# Patient Record
Sex: Female | Born: 1990 | Hispanic: Yes | Marital: Single | Smoking: Never smoker
Health system: Southern US, Community
[De-identification: ages and names within clinical notes are randomized; demographics above are authoritative.]

## PROBLEM LIST (undated history)

## (undated) DIAGNOSIS — Z3491 Encounter for supervision of normal pregnancy, unspecified, first trimester: Principal | ICD-10-CM

## (undated) DIAGNOSIS — I10 Essential (primary) hypertension: Secondary | ICD-10-CM

## (undated) DIAGNOSIS — D649 Anemia, unspecified: Secondary | ICD-10-CM

## (undated) HISTORY — DX: Anemia, unspecified: D64.9

## (undated) HISTORY — DX: Encounter for supervision of normal pregnancy, unspecified, first trimester: Z34.91

## (undated) HISTORY — DX: Essential (primary) hypertension: I10

---

## 2011-12-07 ENCOUNTER — Other Ambulatory Visit (HOSPITAL_COMMUNITY)
Admission: RE | Admit: 2011-12-07 | Discharge: 2011-12-07 | Disposition: A | Discharge status: Home / Self Care | Payer: Self-pay | Source: Ambulatory Visit | Attending: Unknown Physician Specialty | Admitting: Unknown Physician Specialty

## 2011-12-07 DIAGNOSIS — R87612 Low grade squamous intraepithelial lesion on cytologic smear of cervix (LGSIL): Secondary | ICD-10-CM | POA: Insufficient documentation

## 2011-12-07 DIAGNOSIS — N870 Mild cervical dysplasia: Secondary | ICD-10-CM | POA: Insufficient documentation

## 2012-05-20 ENCOUNTER — Emergency Department (HOSPITAL_COMMUNITY): Payer: Self-pay

## 2012-05-20 ENCOUNTER — Emergency Department (HOSPITAL_COMMUNITY)
Admission: EM | Admit: 2012-05-20 | Discharge: 2012-05-20 | Disposition: A | Discharge status: Home / Self Care | Payer: Self-pay | Attending: Emergency Medicine | Admitting: Emergency Medicine

## 2012-05-20 ENCOUNTER — Encounter (HOSPITAL_COMMUNITY): Payer: Self-pay | Admitting: *Deleted

## 2012-05-20 DIAGNOSIS — N949 Unspecified condition associated with female genital organs and menstrual cycle: Secondary | ICD-10-CM | POA: Insufficient documentation

## 2012-05-20 DIAGNOSIS — O200 Threatened abortion: Secondary | ICD-10-CM | POA: Insufficient documentation

## 2012-05-20 DIAGNOSIS — R109 Unspecified abdominal pain: Secondary | ICD-10-CM | POA: Insufficient documentation

## 2012-05-20 DIAGNOSIS — O219 Vomiting of pregnancy, unspecified: Secondary | ICD-10-CM | POA: Insufficient documentation

## 2012-05-20 LAB — BASIC METABOLIC PANEL
CO2: 22 mEq/L (ref 19–32)
Calcium: 9.6 mg/dL (ref 8.4–10.5)
Chloride: 99 mEq/L (ref 96–112)
Sodium: 132 mEq/L — ABNORMAL LOW (ref 135–145)

## 2012-05-20 LAB — URINALYSIS, ROUTINE W REFLEX MICROSCOPIC
Bilirubin Urine: NEGATIVE
Nitrite: NEGATIVE
Specific Gravity, Urine: 1.01 (ref 1.005–1.030)
Urobilinogen, UA: 0.2 mg/dL (ref 0.0–1.0)

## 2012-05-20 LAB — CBC WITH DIFFERENTIAL/PLATELET
Basophils Absolute: 0 10*3/uL (ref 0.0–0.1)
Lymphocytes Relative: 15 % (ref 12–46)
Neutro Abs: 6.8 10*3/uL (ref 1.7–7.7)
Platelets: 275 10*3/uL (ref 150–400)
RDW: 12.9 % (ref 11.5–15.5)
WBC: 8.8 10*3/uL (ref 4.0–10.5)

## 2012-05-20 LAB — PREGNANCY, URINE: Preg Test, Ur: POSITIVE — AB

## 2012-05-20 LAB — HCG, QUANTITATIVE, PREGNANCY: hCG, Beta Chain, Quant, S: 25758 m[IU]/mL — ABNORMAL HIGH (ref <5)

## 2012-05-20 MED ORDER — PROMETHAZINE HCL 25 MG PO TABS: 25.0000 mg | ORAL_TABLET | Freq: Four times a day (QID) | ORAL | Status: DC | PRN

## 2012-05-20 MED ORDER — ONDANSETRON HCL 4 MG/2ML IJ SOLN
4.0000 mg | Freq: Once | INTRAMUSCULAR | Status: AC
  Administered 2012-05-20: 4 mg via INTRAVENOUS
  Filled 2012-05-20: qty 2

## 2012-05-20 MED ORDER — SODIUM CHLORIDE 0.9 % IV SOLN: INTRAVENOUS | Status: DC

## 2012-05-20 MED ORDER — PRENATAL VITAMINS 28-0.8 MG PO TABS: 1.0000 | ORAL_TABLET | Freq: Every day | ORAL | Status: AC

## 2012-05-20 MED ORDER — SODIUM CHLORIDE 0.9 % IV BOLUS (SEPSIS)
1000.0000 mL | Freq: Once | INTRAVENOUS | Status: AC
  Administered 2012-05-20: 1000 mL via INTRAVENOUS

## 2012-05-20 NOTE — ED Notes (Addendum)
Pt is [redacted] weeks pregnant, not feeling well since Tuesday, vomiting, > 4 times today per pt, denies vaginal bleeding, last office visit on 1 week ago

## 2012-05-20 NOTE — ED Provider Notes (Addendum)
History     CSN: [id number]  Arrival date & time 05/20/12  1814   First MD Initiated Contact with Patient 05/20/12 1907      Chief Complaint  Patient presents with  . Emesis During Pregnancy    (Consider location/radiation/quality/duration/timing/severity/associated sxs/prior treatment) The history is provided by the patient.  patient is a 21 year old female first pregnancy presents with vomiting for the past 2 days thirsty started with abdominal pain yesterday described as 8/10 bilateral lower quadrants. Patient has her first OB/GYN appointment on October 18. Her due date is May 29. Patient is approximately [redacted] weeks pregnant. Patient been vomiting greater than 4 times a day for the past 2 days. No fever no dysuria no vaginal bleeding. The abdominal pain is described as an ache. Nonradiating.  History reviewed. No pertinent past medical history.  History reviewed. No pertinent past surgical history.  History reviewed. No pertinent family history.  History  Substance Use Topics  . Smoking status: Never Smoker   . Smokeless tobacco: Not on file  . Alcohol Use: No    OB History    Grav Para Term Preterm Abortions TAB SAB Ect Mult Living   1               Review of Systems  Constitutional: Negative for fever.  HENT: Negative for congestion and neck pain.   Eyes: Negative for visual disturbance.  Respiratory: Negative for shortness of breath.   Cardiovascular: Negative for chest pain.  Gastrointestinal: Positive for nausea, vomiting and abdominal pain. Negative for diarrhea.  Genitourinary: Positive for pelvic pain. Negative for dysuria, vaginal bleeding and vaginal discharge.  Musculoskeletal: Negative for back pain.  Skin: Negative for rash.  Neurological: Negative for headaches.  Hematological: Does not bruise/bleed easily.    Allergies  Review of patient's allergies indicates no known allergies.  Home Medications   Current Outpatient Rx  Name Route Sig Dispense  Refill  . PRENATAL VITAMINS 28-0.8 MG PO TABS Oral Take 1 tablet by mouth daily. 30 tablet 9  . PROMETHAZINE HCL 25 MG PO TABS Oral Take 1 tablet (25 mg total) by mouth every 6 (six) hours as needed for nausea. 20 tablet 0    BP 119/66  Pulse 74  Temp 98.6 F (37 C) (Oral)  Resp 20  Ht 5\' 3"  (1.6 m)  Wt 114 lb (51.71 kg)  BMI 20.19 kg/m2  SpO2 100%  LMP 03/29/2012  Physical Exam  Nursing note and vitals reviewed. Constitutional: She is oriented to person, place, and time. She appears well-developed and well-nourished. No distress.  HENT:  Head: Normocephalic and atraumatic.  Mouth/Throat: Oropharynx is clear and moist.  Eyes: Conjunctivae normal and EOM are normal. Pupils are equal, round, and reactive to light.  Neck: Normal range of motion. Neck supple.  Cardiovascular: Normal rate, regular rhythm and normal heart sounds.   No murmur heard. Pulmonary/Chest: Effort normal and breath sounds normal. No respiratory distress.  Abdominal: Soft. Bowel sounds are normal. There is no tenderness.  Musculoskeletal: Normal range of motion. She exhibits no edema and no tenderness.  Neurological: She is alert and oriented to person, place, and time. No cranial nerve deficit. She exhibits normal muscle tone. Coordination normal.  Skin: Skin is warm. No rash noted.    ED Course  Procedures (including critical care time)   Labs Reviewed  URINALYSIS, ROUTINE W REFLEX MICROSCOPIC  PREGNANCY, URINE  CBC WITH DIFFERENTIAL  BASIC METABOLIC PANEL  HCG, QUANTITATIVE, PREGNANCY   US Ob Comp  Less 14 Wks  05/20/2012  *RADIOLOGY REPORT*  Clinical Data: Vomiting, pelvic pain  OBSTETRIC <14 WK [place] AND TRANSVAGINAL OB US  Technique:  Both transabdominal and transvaginal ultrasound examinations were performed for complete evaluation of the gestation as well as the maternal uterus, adnexal regions, and pelvic cul-de-sac.  Transvaginal technique was performed to assess early pregnancy.  Comparison:   None.  Intrauterine gestational sac:  Visualized/normal in shape. Yolk sac: Present Embryo: Present Cardiac Activity: Present Heart Rate: 102 bpm  CRL: 3  mm  5 w  6 d          [place] EDC: 01/14/2013  Maternal uterus/adnexae: Small subchronic hemorrhage.  Right ovary is notable for a 3.7 x 2.4 x 3.0 cm corpus luteal cyst.  Left ovary is within normal limits.  IMPRESSION: Single live intrauterine gestation with estimated gestational age [redacted] weeks 6 days by crown-rump length.   Original Report Authenticated By: Charline Bills, M.D.    US Ob Transvaginal  05/20/2012  *RADIOLOGY REPORT*  Clinical Data: Vomiting, pelvic pain  OBSTETRIC <14 WK [place] AND TRANSVAGINAL OB US  Technique:  Both transabdominal and transvaginal ultrasound examinations were performed for complete evaluation of the gestation as well as the maternal uterus, adnexal regions, and pelvic cul-de-sac.  Transvaginal technique was performed to assess early pregnancy.  Comparison:  None.  Intrauterine gestational sac:  Visualized/normal in shape. Yolk sac: Present Embryo: Present Cardiac Activity: Present Heart Rate: 102 bpm  CRL: 3  mm  5 w  6 d          [place] EDC: 01/14/2013  Maternal uterus/adnexae: Small subchronic hemorrhage.  Right ovary is notable for a 3.7 x 2.4 x 3.0 cm corpus luteal cyst.  Left ovary is within normal limits.  IMPRESSION: Single live intrauterine gestation with estimated gestational age [redacted] weeks 6 days by crown-rump length.   Original Report Authenticated By: Charline Bills, M.D.      1. Vomiting or nausea of pregnancy   2. Threatened miscarriage in early pregnancy       MDM  Main concern is be for an ectopic pregnancy. Patient will also be hydrated and given anti-medics medications. Labs are not crossing over on the computer. Her urinalysis shows a spec gravity of 1.010 which is not specifically concentrated there were no ketones in her urine no evidence urinary tract infection. White count is 8000 hemoglobin and hematocrit  is 12 and 35 quantitative hCG is still spending electrolytes are normal CO2 is 22 BUN is 7 creatinine 0.5 no evidence of any renal insufficiency no evidence of any acidosis. Ultrasound is been ordered to rule out an ectopic based on labs is no significant evidence of dehydration or starvation state for the baby. Patient does not have any vaginal bleeding so Rh factor is not of concern currently. Abdomen is nontender.  Quantitative beta-hCG came back at 25,758. Ultrasound is still pending.  Ultrasound tech 7 intrauterine pregnancy at about 5 weeks almost [redacted] weeks gestation. No evidence of ectopic pregnancy. The possibility of a threatened miscarriage is still present. Patient will need to followup with OB/GYN in the next few days for repeat quantitative hCG. We'll discharge the patient home with Phenergan. No evidence of significant dehydration from the history of vomiting.        Shelda Jakes, MD 05/20/12 5284  Shelda Jakes, MD 05/20/12 2132

## 2012-05-26 ENCOUNTER — Other Ambulatory Visit: Payer: Self-pay | Admitting: Adult Health

## 2012-05-26 ENCOUNTER — Other Ambulatory Visit (HOSPITAL_COMMUNITY)
Admission: RE | Admit: 2012-05-26 | Discharge: 2012-05-26 | Disposition: A | Discharge status: Home / Self Care | Payer: Self-pay | Source: Ambulatory Visit | Attending: Obstetrics and Gynecology | Admitting: Obstetrics and Gynecology

## 2012-05-26 DIAGNOSIS — Z01419 Encounter for gynecological examination (general) (routine) without abnormal findings: Secondary | ICD-10-CM | POA: Insufficient documentation

## 2012-05-26 DIAGNOSIS — Z113 Encounter for screening for infections with a predominantly sexual mode of transmission: Secondary | ICD-10-CM | POA: Insufficient documentation

## 2012-05-26 LAB — SICKLE CELL SCREEN: Sickle Cell Screen: NEGATIVE

## 2012-05-26 LAB — OB RESULTS CONSOLE RPR: RPR: NONREACTIVE

## 2012-05-26 LAB — OB RESULTS CONSOLE HEPATITIS B SURFACE ANTIGEN: Hepatitis B Surface Ag: NEGATIVE

## 2012-05-26 LAB — OB RESULTS CONSOLE ANTIBODY SCREEN: Antibody Screen: NEGATIVE

## 2012-05-26 LAB — OB RESULTS CONSOLE HIV ANTIBODY (ROUTINE TESTING): HIV: NONREACTIVE

## 2012-05-26 LAB — OB RESULTS CONSOLE VARICELLA ZOSTER ANTIBODY, IGG: Varicella: IMMUNE

## 2012-06-17 ENCOUNTER — Emergency Department (HOSPITAL_COMMUNITY): Payer: Self-pay

## 2012-06-17 ENCOUNTER — Emergency Department (HOSPITAL_COMMUNITY)
Admission: EM | Admit: 2012-06-17 | Discharge: 2012-06-17 | Disposition: A | Discharge status: Home / Self Care | Payer: Self-pay | Attending: Emergency Medicine | Admitting: Emergency Medicine

## 2012-06-17 ENCOUNTER — Encounter (HOSPITAL_COMMUNITY): Payer: Self-pay | Admitting: *Deleted

## 2012-06-17 DIAGNOSIS — B9689 Other specified bacterial agents as the cause of diseases classified elsewhere: Secondary | ICD-10-CM

## 2012-06-17 DIAGNOSIS — O4690 Antepartum hemorrhage, unspecified, unspecified trimester: Secondary | ICD-10-CM

## 2012-06-17 DIAGNOSIS — O200 Threatened abortion: Secondary | ICD-10-CM

## 2012-06-17 DIAGNOSIS — N760 Acute vaginitis: Secondary | ICD-10-CM | POA: Insufficient documentation

## 2012-06-17 LAB — CBC
MCHC: 36.2 g/dL — ABNORMAL HIGH (ref 30.0–36.0)
MCV: 87.1 fL (ref 78.0–100.0)
Platelets: 259 10*3/uL (ref 150–400)
RDW: 13.4 % (ref 11.5–15.5)
WBC: 8.4 10*3/uL (ref 4.0–10.5)

## 2012-06-17 LAB — URINALYSIS, ROUTINE W REFLEX MICROSCOPIC
Bilirubin Urine: NEGATIVE
Ketones, ur: NEGATIVE mg/dL
Nitrite: NEGATIVE
Protein, ur: NEGATIVE mg/dL
Specific Gravity, Urine: >1.03 — ABNORMAL HIGH (ref 1.005–1.030)
Urobilinogen, UA: 0.2 mg/dL (ref 0.0–1.0)

## 2012-06-17 LAB — WET PREP, GENITAL
Trich, Wet Prep: NONE SEEN
Yeast Wet Prep HPF POC: NONE SEEN

## 2012-06-17 LAB — URINE MICROSCOPIC-ADD ON

## 2012-06-17 MED ORDER — METRONIDAZOLE 500 MG PO TABS: 500.0000 mg | ORAL_TABLET | Freq: Two times a day (BID) | ORAL | Status: DC

## 2012-06-17 NOTE — ED Provider Notes (Signed)
History     CSN: [id number]  Arrival date & time 06/17/12  1105   First MD Initiated Contact with Patient 06/17/12 1116      Chief Complaint  Patient presents with  . Pregnant, vag bleeding      HPI Pt was seen at 1205.  Per pt, c/o gradual onset and persistence of constant pelvic "cramping" for the past 2 days, and vaginal bleeding "with clots" since last night.  Pt has hx G1P0, LMP 03/29/12 with EGA 11 3/7 weeks. Pt states she was eval in the ED last month for N/V and threatened miscarriage, seen by OB/GYN few weeks ago in f/u.  Denies vaginal discharge, no back pain, no fevers.     OB[facility]N: [facility] History reviewed. No pertinent past medical history.  History reviewed. No pertinent past surgical history.   History  Substance Use Topics  . Smoking status: Never Smoker   . Smokeless tobacco: Not on file  . Alcohol Use: No    OB History    Grav Para Term Preterm Abortions TAB SAB Ect Mult Living   1               Review of Systems ROS: Statement: All systems negative except as marked or noted in the HPI; Constitutional: Negative for fever and chills. ; ; Eyes: Negative for eye pain, redness and discharge. ; ; ENMT: Negative for ear pain, hoarseness, nasal congestion, sinus pressure and sore throat. ; ; Cardiovascular: Negative for chest pain, palpitations, diaphoresis, dyspnea and peripheral edema. ; ; Respiratory: Negative for cough, wheezing and stridor. ; ; Gastrointestinal: Negative for nausea, vomiting, diarrhea, abdominal pain, blood in stool, hematemesis, jaundice and rectal bleeding. . ; ; Genitourinary: Negative for dysuria, flank pain and hematuria. ; ; GYN:  +pelvic cramping, +vaginal bleeding, no vaginal discharge, no vulvar pain.;; Musculoskeletal: Negative for back pain and neck pain. Negative for swelling and trauma.; ; Skin: Negative for pruritus, rash, abrasions, blisters, bruising and skin lesion.; ; Neuro: Negative for headache, lightheadedness and neck  stiffness. Negative for weakness, altered level of consciousness , altered mental status, extremity weakness, paresthesias, involuntary movement, seizure and syncope.       Allergies  Review of patient's allergies indicates no known allergies.  Home Medications   Current Outpatient Rx  Name  Route  Sig  Dispense  Refill  . PRENATAL VITAMINS 28-0.8 MG PO TABS   Oral   Take 1 tablet by mouth daily.   30 tablet   9   . PROMETHAZINE HCL 25 MG PO TABS   Oral   Take 1 tablet (25 mg total) by mouth every 6 (six) hours as needed for nausea.   20 tablet   0     BP 122/52  Pulse 88  Temp 98.4 F (36.9 C) (Oral)  Resp 16  Ht 5\' 2"  (1.575 m)  Wt 114 lb (51.71 kg)  BMI 20.85 kg/m2  SpO2 100%  LMP 03/29/2012  Physical Exam 1210: Physical examination:  Nursing notes reviewed; Vital signs and O2 SAT reviewed;  Constitutional: Well developed, Well nourished, Well hydrated, In no acute distress; Head:  Normocephalic, atraumatic; Eyes: EOMI, PERRL, No scleral icterus; ENMT: Mouth and pharynx normal, Mucous membranes moist; Neck: Supple, Full range of motion, No lymphadenopathy; Cardiovascular: Regular rate and rhythm, No murmur, rub, or gallop; Respiratory: Breath sounds clear & equal bilaterally, No rales, rhonchi, wheezes.  Speaking full sentences with ease, Normal respiratory effort/excursion; Chest: Nontender, Movement normal; Abdomen: Soft, Nontender, Nondistended,  Normal bowel sounds;; Pelvic exam performed with permission of pt and female ED tech assist during exam.  External genitalia w/o lesions. Vaginal vault with thin clear discharge, scant brown blood in vault.  Cervix w/o lesions, not friable, closed, no bleeding from os.  GC/chlam and wet prep obtained and sent to lab.  Bimanual exam w/o CMT, uterine or adnexal tenderness.;; Extremities: Pulses normal, No tenderness, No edema, No calf edema or asymmetry.; Neuro: AA&Ox3, Major CN grossly intact.  Speech clear. No gross focal motor or  sensory deficits in extremities.; Skin: Color normal, Warm, Dry.   ED Course  Procedures    MDM  MDM Reviewed: nursing note, vitals and previous chart Reviewed previous: labs and ultrasound Interpretation: labs and ultrasound   Results for orders placed during the hospital encounter of 06/17/12  ABO/RH      Component Value Range   ABO/RH(D) O POS    CBC      Component Value Range   WBC 8.4  4.0 - 10.5 K/uL   RBC 4.03  3.87 - 5.11 MIL/uL   Hemoglobin 12.7  12.0 - 15.0 g/dL   HCT 16.1 (*) 09.6 - 04.5 %   MCV 87.1  78.0 - 100.0 fL   MCH 31.5  26.0 - 34.0 pg   MCHC 36.2 (*) 30.0 - 36.0 g/dL   RDW 40.9  81.1 - 91.4 %   Platelets 259  150 - 400 K/uL  URINALYSIS, ROUTINE W REFLEX MICROSCOPIC      Component Value Range   Color, Urine YELLOW  YELLOW   APPearance CLEAR  CLEAR   Specific Gravity, Urine >1.030 (*) 1.005 - 1.030   pH 6.5  5.0 - 8.0   Glucose, UA 100 (*) NEGATIVE mg/dL   Hgb urine dipstick SMALL (*) NEGATIVE   Bilirubin Urine NEGATIVE  NEGATIVE   Ketones, ur NEGATIVE  NEGATIVE mg/dL   Protein, ur NEGATIVE  NEGATIVE mg/dL   Urobilinogen, UA 0.2  0.0 - 1.0 mg/dL   Nitrite NEGATIVE  NEGATIVE   Leukocytes, UA NEGATIVE  NEGATIVE  WET PREP, GENITAL      Component Value Range   Yeast Wet Prep HPF POC NONE SEEN  NONE SEEN   Trich, Wet Prep NONE SEEN  NONE SEEN   Clue Cells Wet Prep HPF POC FEW (*) NONE SEEN   WBC, Wet Prep HPF POC FEW (*) NONE SEEN  HCG, QUANTITATIVE, PREGNANCY      Component Value Range   hCG, Beta Chain, Quant, S [id number] (*) <5 mIU/mL  URINE MICROSCOPIC-ADD ON      Component Value Range   Squamous Epithelial / LPF FEW (*) RARE   WBC, UA 3-6  <3 WBC/hpf   RBC / HPF 3-6  <3 RBC/hpf   Bacteria, UA FEW (*) RARE   US Ob Comp Less 14 Wks 06/17/2012  *RADIOLOGY REPORT*  Clinical Data: Vaginal bleeding, pregnant.  OBSTETRIC <14 WK [place] AND TRANSVAGINAL OB US  Technique:  Both transabdominal and transvaginal ultrasound examinations were performed for  complete evaluation of the gestation as well as the maternal uterus, adnexal regions, and pelvic cul-de-sac.  Transvaginal technique was performed to assess early pregnancy.  Comparison:  Ultrasound 05/20/2012  Intrauterine gestational sac:  Single and present Yolk sac: Present Embryo: Present Cardiac Activity: Present Heart Rate: 171 bpm  CRL: 34  mm  10 w  2 d  Maternal uterus/adnexae: Ovaries are normal. Corpus luteal cyst in the right ovary.  No free fluid.  Small subchorionic  hemorrhage is present.  IMPRESSION:  1.  Single intrauterine gestation with embryo and normal cardiac activity. 2.  Estimate gestational age by crown-rump length equals 10 weeks 2 days.  3.  Small subchorionic hemorrhage.   Original Report Authenticated By: Genevive Bi, M.D.    US Ob Comp Less 14 Wks 05/20/2012  *RADIOLOGY REPORT*  Clinical Data: Vomiting, pelvic pain  OBSTETRIC <14 WK [place] AND TRANSVAGINAL OB US  Technique:  Both transabdominal and transvaginal ultrasound examinations were performed for complete evaluation of the gestation as well as the maternal uterus, adnexal regions, and pelvic cul-de-sac.  Transvaginal technique was performed to assess early pregnancy.  Comparison:  None.  Intrauterine gestational sac:  Visualized/normal in shape. Yolk sac: Present Embryo: Present Cardiac Activity: Present Heart Rate: 102 bpm  CRL: 3  mm  5 w  6 d          [place] EDC: 01/14/2013  Maternal uterus/adnexae: Small subchronic hemorrhage.  Right ovary is notable for a 3.7 x 2.4 x 3.0 cm corpus luteal cyst.  Left ovary is within normal limits.  IMPRESSION: Single live intrauterine gestation with estimated gestational age [redacted] weeks 6 days by crown-rump length.   Original Report Authenticated By: Charline Bills, M.D.      1355:  O positive.  +BV, will tx.  GC/chlam pending.  [place] with continued viable intrauterine fetus at this time.  Strict threatened miscarriage precautions given.  Dx and testing d/w pt and family.  Questions answered.   Verb understanding, agreeable to d/c home with outpt f/u with her OB/GYN on Monday.            Laray Anger, DO 06/19/12 1506

## 2012-06-17 NOTE — ED Notes (Signed)
Patient with no complaints at this time. Respirations even and unlabored. Skin warm/dry. Discharge instructions reviewed with patient at this time. Patient given opportunity to voice concerns/ask questions. Patient discharged at this time and left Emergency Department with steady gait.   

## 2012-06-17 NOTE — ED Notes (Signed)
Pt is [redacted] weeks pregnant. States lower abdominal pain began Thursday and vaginal bleeding began last night. Today, bright red bleeding with clots.

## 2012-06-18 LAB — URINE CULTURE

## 2012-06-20 LAB — GC/CHLAMYDIA PROBE AMP, GENITAL: Chlamydia, DNA Probe: NEGATIVE

## 2012-08-15 ENCOUNTER — Emergency Department (HOSPITAL_COMMUNITY)
Admission: EM | Admit: 2012-08-15 | Discharge: 2012-08-15 | Disposition: A | Discharge status: Home / Self Care | Payer: Self-pay | Attending: Emergency Medicine | Admitting: Emergency Medicine

## 2012-08-15 ENCOUNTER — Encounter (HOSPITAL_COMMUNITY): Payer: Self-pay

## 2012-08-15 DIAGNOSIS — O99891 Other specified diseases and conditions complicating pregnancy: Secondary | ICD-10-CM | POA: Insufficient documentation

## 2012-08-15 DIAGNOSIS — O4690 Antepartum hemorrhage, unspecified, unspecified trimester: Secondary | ICD-10-CM | POA: Insufficient documentation

## 2012-08-15 DIAGNOSIS — R109 Unspecified abdominal pain: Secondary | ICD-10-CM | POA: Insufficient documentation

## 2012-08-15 DIAGNOSIS — N949 Unspecified condition associated with female genital organs and menstrual cycle: Secondary | ICD-10-CM

## 2012-08-15 DIAGNOSIS — Z79899 Other long term (current) drug therapy: Secondary | ICD-10-CM | POA: Insufficient documentation

## 2012-08-15 DIAGNOSIS — Z3490 Encounter for supervision of normal pregnancy, unspecified, unspecified trimester: Secondary | ICD-10-CM

## 2012-08-15 NOTE — ED Provider Notes (Signed)
History     CSN: [id number]  Arrival date & time 08/15/12  2028   First MD Initiated Contact with Patient 08/15/12 2044      Chief Complaint  Patient presents with  . Abdominal Pain  . Vaginal Bleeding  . Pregnant     (Consider location/radiation/quality/duration/timing/severity/associated sxs/prior treatment) Patient is a 22 y.o. female presenting with abdominal pain and vaginal bleeding. The history is provided by the patient and medical records. No language interpreter was used.  Abdominal Pain The primary symptoms of the illness include abdominal pain and vaginal bleeding. The primary symptoms of the illness do not include fever. Primary symptoms comment: Pt is appx [redacted] weeks pregnant.  She had onset of lower abdominal cramping and vaginal bleeding about 2 hours ago. The onset of the illness was sudden. The problem has not changed since onset. The abdominal pain began 1 to 2 hours ago. The pain came on suddenly. The abdominal pain has been unchanged since its onset. The abdominal pain is located in the suprapubic region. The abdominal pain does not radiate. The severity of the abdominal pain is 5/10. The abdominal pain is relieved by nothing. The abdominal pain is exacerbated by vomiting.  Vaginal bleeding was first noticed today. Vaginal bleeding other than menses is a new problem. Vaginal bleeding is unchanged since it began. Vaginal bleeding occurred 1 time. The quantity of blood was heavier than menses.  The patient states that she believes she is currently pregnant. The patient has not had a change in bowel habit. Symptoms associated with the illness do not include chills or back pain.  Vaginal Bleeding Associated symptoms include abdominal pain.    History reviewed. No pertinent past medical history.  History reviewed. No pertinent past surgical history.  No family history on file.  History  Substance Use Topics  . Smoking status: Never Smoker   . Smokeless tobacco: Not on  file  . Alcohol Use: No    OB History    Grav Para Term Preterm Abortions TAB SAB Ect Mult Living   1               Review of Systems  Constitutional: Negative for fever and chills.  HENT: Negative.   Eyes: Negative.   Cardiovascular: Negative.   Gastrointestinal: Positive for abdominal pain.  Genitourinary: Positive for vaginal bleeding.  Musculoskeletal: Negative.  Negative for back pain.  Neurological: Negative.   Psychiatric/Behavioral: Negative.     Allergies  Review of patient's allergies indicates no known allergies.  Home Medications   Current Outpatient Rx  Name  Route  Sig  Dispense  Refill  . PRENATAL VITAMINS 28-0.8 MG PO TABS   Oral   Take 1 tablet by mouth daily.   30 tablet   9   . AMOXICILLIN 500 MG PO TABS   Oral   Take 500 mg by mouth 3 (three) times daily. Started on 06/15/12 for a 7 day course.         Marland Kitchen METRONIDAZOLE 500 MG PO TABS   Oral   Take 1 tablet (500 mg total) by mouth 2 (two) times daily.   14 tablet   0     BP 116/63  Pulse 73  Temp 98.3 F (36.8 C) (Oral)  Resp 20  Wt 121 lb (54.885 kg)  SpO2 97%  LMP 03/29/2012  Physical Exam  Nursing note and vitals reviewed. Constitutional: She is oriented to person, place, and time. She appears well-developed and well-nourished. Distressed: in mild  distress with suprapubic pain.  HENT:  Head: Normocephalic and atraumatic.  Right Ear: External ear normal.  Left Ear: External ear normal.  Mouth/Throat: Oropharynx is clear and moist.  Eyes: Conjunctivae normal and EOM are normal. Pupils are equal, round, and reactive to light.  Neck: Normal range of motion.  Cardiovascular: Normal rate, regular rhythm and normal heart sounds.   Pulmonary/Chest: Effort normal and breath sounds normal.  Abdominal: Soft. Bowel sounds are normal. Distention: uterus palpable to approximately the level of the umbilicus.  Genitourinary:       Normal external genitalia.  Has white discharge, no bleeding.   Bimanual exam shows CX closed.    Musculoskeletal: Normal range of motion. She exhibits no edema.  Neurological: She is alert and oriented to person, place, and time.       No sensory or motor deficit.  Skin: Skin is warm and dry.  Psychiatric: She has a normal mood and affect. Her behavior is normal.    ED Course  Procedures (including critical care time)  9:24 PM Discussed case with Selena Batten, Certified Nurse Midwife at [facility].  Where her fetal monitoring is good and she has no bleeding, her pain should be regarded as round ligament pain.  She can take Tylenol or Ibuprofen.  She should followup with Dr. Emelda Fear in the office, or if the pain gets worse she should go to [facility] for further evaluation.    1. Pregnancy   2. Round ligament pain         Carleene Cooper III, MD 08/15/12 2130

## 2012-08-15 NOTE — Progress Notes (Signed)
Pt is a G1P0, EDC 01/04/13 ([redacted]w[redacted]d).  Discussed w/Brenda RN, no need for cont. Fetal monitoring for less then 20 weeks.  Suggested removing fetal monitor and adjusting toco to better assess contraction activity.

## 2012-08-15 NOTE — ED Notes (Signed)
5 months pregnant, having abdominal pain and a little vaginal bleeding per pt.

## 2012-10-21 ENCOUNTER — Encounter: Payer: Self-pay | Admitting: *Deleted

## 2012-10-27 ENCOUNTER — Other Ambulatory Visit: Payer: Self-pay | Admitting: Obstetrics & Gynecology

## 2012-10-27 ENCOUNTER — Ambulatory Visit (INDEPENDENT_AMBULATORY_CARE_PROVIDER_SITE_OTHER): Payer: Self-pay | Admitting: Obstetrics & Gynecology

## 2012-10-27 ENCOUNTER — Other Ambulatory Visit: Payer: Self-pay

## 2012-10-27 ENCOUNTER — Encounter: Payer: Self-pay | Admitting: Obstetrics & Gynecology

## 2012-10-27 VITALS — BP 122/80 | Wt 148.0 lb

## 2012-10-27 DIAGNOSIS — Z3403 Encounter for supervision of normal first pregnancy, third trimester: Secondary | ICD-10-CM

## 2012-10-27 DIAGNOSIS — Z3400 Encounter for supervision of normal first pregnancy, unspecified trimester: Secondary | ICD-10-CM

## 2012-10-27 DIAGNOSIS — Z3490 Encounter for supervision of normal pregnancy, unspecified, unspecified trimester: Secondary | ICD-10-CM

## 2012-10-27 LAB — POCT URINALYSIS DIPSTICK
Glucose, UA: NEGATIVE
Nitrite, UA: NEGATIVE

## 2012-10-27 LAB — CBC
Hemoglobin: 11.5 g/dL — ABNORMAL LOW (ref 12.0–15.0)
Platelets: 236 10*3/uL (ref 150–400)
RBC: 3.69 MIL/uL — ABNORMAL LOW (ref 3.87–5.11)
WBC: 10.7 10*3/uL — ABNORMAL HIGH (ref 4.0–10.5)

## 2012-10-27 LAB — HIV ANTIBODY (ROUTINE TESTING W REFLEX): HIV: NONREACTIVE

## 2012-10-27 NOTE — Progress Notes (Signed)
Judith King is having some lower cramps but not frequent and not painful.  No bleeding or gushes of fluid.  Good fetal movement

## 2012-10-27 NOTE — Patient Instructions (Addendum)
Embarazo  Tercer trimestre  (Pregnancy - Third Trimester) El tercer trimestre del embarazo (los ltimos 3 meses) es el perodo en el cual tanto usted como su beb crecen con ms rapidez. El beb alcanza un largo de aproximadamente 50 cm. y pesa entre 2,700 y 4,500 kg. El beb gana ms tejido graso y est listo para la vida fuera del cuerpo de la madre. Mientras estn en el interior, los bebs tienen perodos de sueo y vigilia, succionan el pulgar y tienen hipo. Quizs sienta pequeas contracciones del tero. Este es el falso trabajo de parto. Tambin se las conoce como contracciones de Braxton-Hicks . Es como una prctica del parto. Los problemas ms habituales de esta etapa del embarazo incluyen mayor dificultad para respirar, hinchazn de las manos y los pies por retencin de lquidos y la necesidad de orinar con ms frecuencia debido a que el tero y el beb presionan sobre la vejiga.  EXAMENES PRENATALES   Durante los exmenes prenatales, deber seguir realizndose anlisis de sangre. Estas pruebas se realizan para controlar su salud y la del beb. Los anlisis de sangre se realizan para conocer los niveles de algunos compuestos de la sangre (hemoglobina). La anemia (bajo nivel de hemoglobina) es frecuente durante el embarazo. Para prevenirla, se administran hierro y vitaminas. Tambin le tomarn nuevas anlisis para descartar diabetes. Podrn repetirle algunas de las pruebas que le hicieron previamente.  En cada visita le medirn el tamao del tero. Esto permite asegurar que el beb se desarrolla adecuadamente, segn la fecha del embarazo.  Le controlarn la presin arterial en cada visita prenatal. Esto es para asegurarse de que no sufre toxemia.  Le harn un anlisis de orina en cada visita prenatal, para descartar infecciones, diabetes y la presencia de protenas.  Tambin en cada visita controlarn su peso. Esto se realiza para asegurarse que aumenta de peso al ritmo indicado y que usted y su  beb evolucionan normalmente.  En algunas ocasiones se realiza una prueba de ultrasonido para confirmar el correcto desarrollo y evolucin del beb. Esta prueba se realiza con ondas sonoras inofensivas para el beb, de modo que el profesional pueda calcular ms precisamente la fecha del parto.  Analice con su mdico los analgsicos y la anestesia que recibir durante el trabajo de parto y el parto.  Comente la posibilidad de que necesite una cesrea y qu anestesia se recibir.  Informe a su mdico si sufre violencia familiar mental o fsica. A veces, se indica la prueba especializada sin estrs, la prueba de tolerancia a las contracciones y el perfil biofsico para asegurarse de que el beb no tiene problemas. El estudio del lquido amnitico que rodea al beb se llama amniocentesis. El lquido amnitico se obtiene introduciendo una aguja en el vientre (abdomen ). En ocasiones se lleva a cabo cerca del final del embarazo, si es necesario inducir a un parto. En este caso se realiza para asegurarse que los pulmones del beb estn lo suficientemente maduros como para que pueda vivir fuera del tero. Si los pulmones no han madurado y es peligroso que el beb nazca, se administrar a la madre una inyeccin de cortisona , 1 a 2 das antes del parto. . Esto ayuda a que los pulmones del beb maduren y sea ms seguro su nacimiento.  CAMBIOS QUE OCURREN EN EL TERCER TRIMESTRE DEL EMBARAZO  Su organismo atravesar numerosos cambios durante el embarazo. Estos pueden variar de una persona a otra. Converse con el profesional que la asiste acerca los cambios que   usted note y que la preocupen.   Durante el ltimo trimestre probablemente sienta un aumento del apetito. Es normal tener "antojos" de ciertas comidas. Esto vara de una persona a otra y de un embarazo a otro.  Podrn aparecer las primeras estras en las caderas, abdomen y mamas. Estos son cambios normales del cuerpo durante el embarazo. No existen  medicamentos ni ejercicios que puedan prevenir estos cambios.  La constipacin puede tratarse con un laxante o agregando fibra a su dieta. Beber grandes cantidades de lquidos, tomar fibras en forma de vegetales, frutas y granos integrales es de gran ayuda.  Tambin es beneficioso practicar actividad fsica. Si ha sido una persona activa hasta el embarazo, podr continuar con la mayora de las actividades durante el mismo. Si ha sido menos activa, puede ser beneficioso que comience con un programa de ejercicios, como realizar caminatas. Consulte con el profesional que la asiste antes de comenzar un programa de ejercicios.  Evite el consumo de cigarrillos, el alcohol, los medicamentos no recetados y las "drogas de la calle" durante el embarazo. Estas sustancias qumicas afectan la formacin y el desarrollo del beb. Evite estas sustancias durante todo el embarazo para asegurar el nacimiento de un beb sano.  Podr sentir dolor de espalda, tener vrices en las venas y hemorroides, o si ya los sufra, pueden empeorar.  Durante el tercer trimestre se cansar con ms facilidad, lo cual es normal.  Los movimientos del beb pueden ser ms fuertes y con ms frecuencia.  Puede que note dificultades para respirar normalmente.  El ombligo puede salir hacia afuera.  A veces sale una secrecin amarilla de las mamas, que se llama calostro.  Podr aparecer una secrecin mucosa con sangre. Esto suele ocurrir entre unos pocos das y una semana antes del parto. INSTRUCCIONES PARA EL CUIDADO EN EL HOGAR   Cumpla con las citas de control. Siga las indicaciones del mdico con respecto al uso de medicamentos, los ejercicios y la dieta.  Durante el embarazo debe obtener nutrientes para usted y para su beb. Consuma alimentos balanceados a intervalos regulares. Elija alimentos como carne, pescado, leche y otros productos lcteos descremados, vegetales, frutas, panes integrales y cereales. El mdico le informar  cul es el aumento de peso ideal.  Las relaciones sexuales pueden continuarse hasta casi el final del embarazo, si no se presentan otros problemas como prdida prematura (antes de tiempo) de lquido amnitico, hemorragia vaginal o dolor en el vientre (abdominal).  Realice actividad fsica todos los das, si no tiene restricciones. Consulte con el profesional que la asiste si no sabe con certeza si determinados ejercicios son seguros. El mayor aumento de peso se producir en los ltimos 2 trimestres del embarazo. El ejercicio ayuda a:  Controlar su peso.  Mantenerse en forma para el trabajo de parto y el parto .  Perder peso despus del parto.  Haga reposo con frecuencia, con las piernas elevadas, o segn lo necesite para evitar los calambres y el dolor de cintura.  Use un buen sostn o como los que se usan para hacer deportes para aliviar la sensibilidad de las mamas. Tambin puede serle til si lo usa mientras duerme. Si pierde calostro, podr utilizar apsitos en el sostn.  No utilice la baera con agua caliente, baos turcos y saunas.  Colquese el cinturn de seguridad cuando conduzca. Este la proteger a usted y al beb en caso de accidente.  Evite comer carne cruda y el contacto con los utensilios y desperdicios de los gatos. Estos elementos   contienen grmenes que pueden causar defectos de nacimiento en el beb.  Es fcil perder algo de orina durante el embarazo. Apretar y fortalecer los msculos de la pelvis la ayudar con este problema. Practique detener la miccin cuando est en el bao. Estos son los mismos msculos que necesita fortalecer. Son tambin los mismos msculos que utiliza cuando trata de evitar despedir gases. Puede practicar apretando estos msculos diez veces, y repetir esto tres veces por da aproximadamente. Una vez que conozca qu msculos debe apretar, no realice estos ejercicios durante la miccin. Puede favorecerle una infeccin si la orina vuelve hacia  atrs.  Pida ayuda si tienen necesidades financieras, teraputicas o nutricionales. El profesional podr ayudarla con respecto a estas necesidades, o derivarla a otros especialistas.  Haga una lista de nmeros telefnicos de emergencia y tngalos disponibles.  Planifique como obtener ayuda de familiares o amigos cuando regrese a casa desde el hospital.  Hacer un ensayo sobre la partida al hospital.  Tome clases prenatales con el padre para entender, practicar y hacer preguntas sobre el trabajo de parto y el alumbramiento.  Preparar la habitacin del beb / busque una guardera.  No viaje fuera de la ciudad a menos que sea absolutamente necesario y con el asesoramiento de su mdico.  Use slo zapatos de tacn bajo o sin tacn para tener mejor equilibrio y evitar cadas. USO DE MEDICAMENTOS Y CONSUMO DE DROGAS DURANTE EL EMBARAZO   Tome las vitaminas apropiadas para esta etapa tal como se le indic. Las vitaminas deben contener un miligramo de cido flico. Guarde todas las vitaminas fuera del alcance de los nios. La ingestin de slo un par de vitaminas o tabletas que contengan hierro pueden ocasionar la muerte en un beb o en un nio pequeo.  Evite el uso de todos los medicamentos, incluyendo hierbas, medicamentos de venta libre, sin receta o que no hayan sido sugeridos por su mdico. Slo tome medicamentos de venta libre o medicamentos recetados para el dolor, el malestar o fiebre como lo indique su mdico. No tome aspirina, ibuprofeno (Motrin, Advil, Nuprin) o naproxeno (Aleve) excepto que su mdico se lo indique.  Infrmele al profesional si consume alguna droga.  El alcohol se relaciona con ciertos defectos congnitos. Incluye el sndrome de alcoholismo fetal. Debe evitar absolutamente el consumo de alcohol, en cualquier forma. El fumar produce baja tasa de natalidad y bebs prematuros.  Las drogas ilegales o de la calle son muy perjudiciales para el beb. Estn absolutamente  prohibidas. Un beb que nace de una madre adicta, ser adicto al nacer. Ese beb tendr los mismos sntomas de abstinencia que un adulto. SOLICITE ATENCIN MDICA SI:  Tiene preguntas o preocupaciones relacionadas con el embarazo. Es mejor que llame para formular las preguntas si no puede esperar hasta la prxima visita, que sentirse preocupada por ellas.  DECISIONES ACERCA DE LA CIRCUNCISIN  Usted puede saber o no cul es el sexo de su beb. Si ya sabe que ser un varn, este es el momento de pensar acerca de la circuncisin. La circuncisin es la extirpacin del prepucio. Esta es la piel que cubre el extremo sensible del pene. No hay un motivo mdico que lo justifique. Generalmente la decisin se toma segn lo que sea popular en ese momento, o segn creencias religiosas. Podr conversar estos temas con su mdico o con el pediatra.  SOLICITE ATENCIN MDICA DE INMEDIATO SI:   La temperatura oral le sube a ms de 102 F (38.9 C) o lo que su mdico le   indique.  Tiene una prdida de lquido por la vagina (canal de parto). Si sospecha una ruptura de las membranas, tmese la temperatura y llame al profesional para informarlo sobre esto.  Observa unas pequeas manchas, una hemorragia vaginal o elimina cogulos. Notifique al profesional acerca de la cantidad y de cuntos apsitos est utilizando.  Presenta un olor desagradable en la secrecin vaginal y observa un cambio en el color, de transparente a blanco.  Ha vomitado durante ms de 24 horas.  Siente escalofros o le sube la fiebre.  Le falta el aire.  Siente ardor al orinar.  Baja o sube ms de 2 libras (900 g), o segn lo indicado por el profesional que la asiste.  Observa que sbitamente se le hinchan el rostro, las manos, los pies o las piernas.  Siente dolor en el vientre (abdominal). Las molestias en el ligamento redondo son una causa benigna frecuente de dolor abdominal durante el embarazo. El profesional que la asiste deber  evaluarla.  Presenta dolor de cabeza intenso que no se alivia.  Tiene problemas visuales, visin doble o borrosa.  Si no siente los movimientos del beb durante ms de 1 hora. Si piensa que el beb no se mueve tanto como lo haca habitualmente, coma algo que contenga azcar y recustese sobre el lado izquierdo durante una hora. El beb debe moverse al menos 4  5 veces por hora. Comunquese inmediatamente si el beb se mueve menos que lo indicado.  Se cae, se ve involucrada en un accidente automovilstico o sufre algn tipo de traumatismo.  En su hogar hay violencia mental o fsica. Document Released: 05/05/2005 Document Revised: 01/25/2012 [employer] Patient Information 2013 [employer].  

## 2012-10-28 LAB — ANTIBODY SCREEN: Antibody Screen: NEGATIVE

## 2012-10-30 LAB — HSV 2 ANTIBODY, IGG: HSV 2 Glycoprotein G Ab, IgG: 0.34 IV

## 2012-10-31 LAB — GLUCOSE TOLERANCE, 1 HOUR (50G) W/O FASTING: Glucose, 1 Hour GTT: 134 mg/dL (ref 70–140)

## 2012-11-20 ENCOUNTER — Encounter: Payer: Self-pay | Admitting: Women's Health

## 2012-11-20 ENCOUNTER — Ambulatory Visit (INDEPENDENT_AMBULATORY_CARE_PROVIDER_SITE_OTHER): Payer: Self-pay | Admitting: Women's Health

## 2012-11-20 VITALS — BP 110/58 | Wt 157.8 lb

## 2012-11-20 DIAGNOSIS — N760 Acute vaginitis: Secondary | ICD-10-CM

## 2012-11-20 DIAGNOSIS — O2390 Unspecified genitourinary tract infection in pregnancy, unspecified trimester: Secondary | ICD-10-CM

## 2012-11-20 DIAGNOSIS — B9689 Other specified bacterial agents as the cause of diseases classified elsewhere: Secondary | ICD-10-CM | POA: Insufficient documentation

## 2012-11-20 DIAGNOSIS — Z3403 Encounter for supervision of normal first pregnancy, third trimester: Secondary | ICD-10-CM

## 2012-11-20 LAB — POCT WET PREP (WET MOUNT)
Clue Cells Wet Prep Whiff POC: POSITIVE
Trichomonas Wet Prep HPF POC: NEGATIVE

## 2012-11-20 LAB — POCT URINALYSIS DIPSTICK
Blood, UA: NEGATIVE
Protein, UA: NEGATIVE

## 2012-11-20 MED ORDER — METRONIDAZOLE 500 MG PO TABS: 500.0000 mg | ORAL_TABLET | Freq: Two times a day (BID) | ORAL | Status: DC

## 2012-11-20 NOTE — Patient Instructions (Addendum)
Decrease your carbohydrate intake (potatoes, rice, pasta, bread).  Vaginosis bacteriana (Bacterial Vaginosis) La vaginosis bacteriana es una infeccin vaginal en la que el equilibrio normal de las bacterias de la vagina se modifica. Este equilibrio normal se ve afectado por un desarrollo excesivo de ciertas bacterias. Hay diferentes tipos de bacteria que causan la vaginosis bacteriana. Es el problema vaginal ms comn en las mujeres de edad frtil. CAUSAS  La causa de este trastorno no se conoce bien. Se produce como consecuencia de un aumento o desequilibrio de las bacterias nocivas.  Algunas actividades o conductas pueden poner en peligro el equilibrio normal de las bacterias en la vagina, y Astronomer. Entre ellas:  Tener un compaero sexual o mltiples compaeros sexuales.  Las duchas vaginales  Usar un dispositivo intrauterino (DIU) como mtodo anticonceptivo.  No se conoce el papel que juega la actividad sexual en el desarrollo de Beatty VB. Sin embargo, las mujeres que nunca tuvieron relaciones sexuales raramente se infectan. El contagio no se produce en asientos de baos, camas, piscinas o por tocar objetos.  SNTOMAS  Flujo vaginal grisceo.  Olor parecido al pescado con la secrecin, en especial despus de Management consultant.  Picazn o irritacin de la vagina y la vulva.  Ardor o dolor al [employer].  Algunas mujeres no presentan ningn sntoma. DIAGNSTICO El mdico realizar un examen vaginal para diagnosticar una vaginosis bacteriana. El mdico le indicar anlisis de laboratorio y observar las muestras del lquido vaginal en el microscopio. Buscar bacterias y clulas anormales (clulas clave), pH mayor a 4.5 y [place] prueba de aminas positivo, todos ellos asociados al BV.  RIESGOS Y COMPLICACIONES  Enfermedad plvica inflamatoria (EPI).  Infecciones luego de una ciruga ginecolgica.  VIH.  Virus del Herpes TRATAMIENTO En algunos casos, la infeccin  desaparece sin tratamiento. Sin embargo, todas las mujeres con sntomas de VB deben tratarse para evitar complicaciones, especialmente si se ha planificado una ciruga ginecolgica. Los compaeros varones generalmente no necesitan tratamiento. Sin embargo, puede contagiarse entre parejas femeninas, de modo que el tratamiento se realiza para Dietitian.   La VB puede tratarse con medicamentos que destruyen grmenes (antibiticos). Estos se presentan en pldoras o en cremas vaginales. Tanto mujeres embarazadas como no embarazadas pueden usar ambos, pero se indican en dosis diferentes. Estos antibiticos no daan al beb.  La VB puede recurrir [employer]. Si esto ocurre, se prescribir un segundo tratamiento con antibiticos.  El tratamiento es importante en el caso de las mujeres [place]. Si no se trata, la VB puede causar [employer]a, especialmente en [employer] que ha tenido un parto prematuro en el pasado. Todas las mujeres embarazadas que tienen sntomas de VB deben ser controladas y tratadas.  En los casos de recurrencia crnica, se prescribe un tratamiento con un gel vaginal dos veces por semana INSTRUCCIONES PARA EL CUIDADO DOMICILIARIO  Tome los medicamentos que le indic el mdico.  No mantenga relaciones sexuales Librarian, academic.  Comunique a sus compaeros sexuales que sufre una infeccin vaginal. Ellos deben concurrir para un control mdico si tienen problemas como una urticaria leve o picazn.  Practique el sexo seguro. Use preservativos. Tenga un solo compaero sexual. PREVENCIN Algunos pasos bsicos de prevencin pueden ayudar a reducir el riesgo de desequilibrio de las bacterias vaginales y de sufrir VB.  No mantener relaciones sexuales (abstinencia)  No utilice duchas vaginales.  Utilice todos los Cardinal Health han prescripto para el [place], aunque los sntomas hayan desaparecido.  Comunique a su  compaero sexual que  sufre una VB. De ese modo podr tratase, si es necesario, y podr Economist. SOLICITE ATENCIN MDICA SI:  Los sntomas no mejoran luego de [address]  [address] de [place].  Aumentan la secrecin, el dolor o la fiebre. ASEGRESE QUE:   Comprende estas instrucciones.  Controlar su enfermedad.  Solicitar ayuda de inmediato si no mejora o empeora. PARA MS INFORMACIN: Division de STD Prevention (DSTDP), Centers for Disease Control and Prevention (Centros para el control y la prevencin de enfermedades, CDC): SolutionApps.co.za American Social Health Association (ASHA): www.ashastd.org  Document Released: 11/02/2007 Document Revised: 10/18/2011 [facility] Patient Information 2013 [place], [place].  Embarazo  Systems analyst trimestre  (Pregnancy - Third Trimester) El tercer trimestre del Psychiatrist (los ltimos 3 meses) es el perodo en el cual tanto usted como su beb crecen con ms rapidez. El beb alcanza un largo de aproximadamente 50 cm. y pesa entre 2,700 y 4,500 kg. El beb gana ms tejido graso y est listo para la vida fuera del cuerpo de la [place]. Mientras estn en el interior, los bebs tienen perodos de sueo y vigilia, Warehouse manager y tienen hipo. Quizs sienta pequeas contracciones del tero. Este es el falso trabajo de [place]. Tambin se las conoce como contracciones de [place] . Es como una prctica del parto. Los problemas ms habituales de esta etapa del embarazo incluyen mayor dificultad para respirar, hinchazn de las manos y los pies por retencin de lquidos y la necesidad de Geographical information systems officer con ms frecuencia debido a que el tero y el beb presionan sobre la vejiga.  EXAMENES PRENATALES   Durante los Manpower Inc, deber seguir realizndose anlisis de [place]. Estas pruebas se realizan para controlar su salud y la del beb. Los ARAMARK Corporation de sangre se Radiographer, therapeutic para [employer] niveles de algunos compuestos de la sangre (hemoglobina). La anemia (bajo nivel de hemoglobina) es  frecuente durante el embarazo. Para prevenirla, se administran hierro y vitaminas. Tambin le tomarn nuevas anlisis para descartar diabetes. Podrn repetirle algunas de las Hov[employer] Enterprises hicieron previamente.  En cada visita le medirn el tamao del tero. Esto permite asegurar que el beb se desarrolla adecuadamente, segn la fecha del embarazo.  Le controlarn la presin arterial en cada visita prenatal. Esto es para asegurarse de que no sufre toxemia.  Le harn un anlisis de orina en cada visita prenatal, para descartar infecciones, diabetes y la presencia de protenas.  Tambin en cada visita controlarn su peso. Esto se realiza para asegurarse que aumenta de peso al ritmo indicado y que usted y su beb evolucionan normalmente.  En algunas ocasiones se realiza una prueba de ultrasonido para confirmar el correcto desarrollo y evolucin del beb. Esta prueba se realiza con ondas sonoras inofensivas para el beb, de modo que el profesional pueda calcular ms precisamente la fecha del [place].  Analice con su mdico los analgsicos y la anestesia que recibir durante el [place] de parto y [place].  Comente la posibilidad de que necesite una cesrea y qu anestesia se recibir.  Informe a su mdico si sufre violencia familiar mental o fsica. A veces, se indica la prueba especializada sin estrs, la prueba de tolerancia a las contracciones y el perfil biofsico para asegurarse de que el beb no tiene problemas. El estudio del lquido amnitico que rodea al beb se llama amniocentesis. El lquido amnitico se obtiene introduciendo una aguja en el vientre (abdomen ). En ocasiones se lleva a cabo cerca del final del embarazo, si es necesario inducir a  un parto. En este caso se realiza para asegurarse que los pulmones del beb estn lo suficientemente maduros como para que pueda vivir fuera del tero. Si los pulmones no han madurado y es peligroso que el beb nazca, se Building services engineer a la madre una  inyeccin de [place] , 1 a 2 [address]  [address] antes del [address]. Vivia Budge ayuda a que los pulmones del beb maduren y sea ms seguro su nacimiento.  CAMBIOS QUE OCURREN EN EL TERCER TRIMESTRE DEL EMBARAZO  Su organismo atravesar numerosos cambios durante el [place]. Estos pueden variar de Neomia Dear persona a otra. Converse con el profesional que la asiste acerca los cambios que usted note y que la preocupen.   Durante el ltimo trimestre probablemente sienta un aumento del apetito. Es normal tener "antojos" de Development worker, community. Esto vara de Neomia Dear persona a otra y de un embarazo a Therapist, art.  Podrn aparecer las primeras estras en las caderas, abdomen y [place]. Estos son cambios normales del cuerpo durante el [place]. No existen medicamentos ni ejercicios que puedan prevenir [employer].  La constipacin puede tratarse con un laxante o agregando fibra a su dieta. Beber grandes cantidades de lquidos, tomar fibras en forma de vegetales, frutas y granos integrales es de gran [place].  Tambin es beneficioso practicar actividad fsica. Si ha sido una persona Engineer, mining, podr continuar con la [employer]ley[employer]son de las actividades durante el mismo. Si ha sido [employer], puede ser beneficioso que comience con un programa de ejercicios, Museum/gallery exhibitions officer. Consulte con el profesional que la asiste antes de comenzar un programa de ejercicios.  Evite el consumo de cigarrillos, el alcohol, los medicamentos no recetados y las "drogas de la calle" durante el Psychiatrist. Estas sustancias qumicas afectan la formacin y el desarrollo del beb. Evite estas sustancias durante todo el embarazo para asegurar el nacimiento de un beb sano.  Podr sentir dolor de espalda, tener vrices en las venas y hemorroides, o si ya los sufra, pueden [place].  Durante el tercer trimestre se cansar con ms facilidad, lo cual es normal.  Los movimientos del beb pueden ser ms fuertes y con ms frecuencia.  Puede que note dificultades para  respirar normalmente.  El ombligo puede salir hacia afuera.  A veces sale Veterinary surgeon de las [place], que se llama Product manager.  Podr aparecer Neomia Dear secrecin mucosa con sangre. Esto suele ocurrir General Electric unos [address] y Neomia Dear semana antes del [place]. INSTRUCCIONES PARA EL CUIDADO EN EL HOGAR   Cumpla con las citas de control. Siga las indicaciones del mdico con respecto al uso de [place], los ejercicios y la dieta.  Durante el embarazo debe obtener nutrientes para usted y para su beb. Consuma alimentos balanceados a intervalos regulares. Elija alimentos como carne, pescado, [place] y otros productos lcteos descremados, vegetales, frutas, panes integrales y cereales. El [employer]ot informar cul es el aumento de peso ideal.  Las relaciones sexuales pueden continuarse hasta casi el final del embarazo, si no se presentan otros problemas como prdida prematura (antes de [place]) de lquido amnitico, hemorragia vaginal o dolor en el vientre (abdominal).  Realice Tesoro Corporation, si no tiene restricciones. Consulte con el profesional que la asiste si no sabe con certeza si determinados ejercicios son seguros. El mayor aumento de peso se producir en los ltimos 2 trimestres del Psychiatrist. El ejercicio ayuda a:  Engineering geologist.  Mantenerse en forma para el trabajo de parto y [place] .  Perder peso despus del parto.  Haga reposo con frecuencia, con las piernas elevadas, o segn lo necesite para evitar los calambres y el dolor de cintura.  Use un buen sostn o como los que se usan para hacer deportes para Paramedic la sensibilidad de las [place]. Tambin puede serle til si lo [place] mientras duerme. Si pierde Product manager, podr Parker [employer]in.  No utilice la baera con agua caliente, baos turcos y saunas.  Colquese el cinturn de seguridad cuando conduzca. Este la proteger a usted y al beb en caso de accidente.  Evite comer carne cruda y el contacto con  los utensilios y desperdicios de los gatos. Estos elementos contienen grmenes que pueden causar defectos de nacimiento en el beb.  Es fcil perder algo de orina durante el [place]. Apretar y Chief Operating Officer los msculos de la pelvis la ayudar con este problema. Practique detener la miccin cuando est en el bao. Estos son los mismos msculos que Development worker, international aid. Son [employer]PP[employer] Partners mismos msculos que utiliza cuando trata de evitar despedir gases. Puede practicar apretando estos msculos WellPoint, y repetir esto tres veces por da aproximadamente. Una vez que conozca qu msculos debe apretar, no realice estos ejercicios durante la miccin. Puede favorecerle una infeccin si la orina vuelve hacia atrs.  Pida ayuda si tienen necesidades financieras, teraputicas o nutricionales. El profesional podr ayudarla con respecto a estas necesidades, o derivarla a otros especialistas.  Haga una lista de nmeros telefnicos de emergencia y tngalos disponibles.  Planifique como obtener ayuda de familiares o amigos cuando regrese a Programmer, applications hospital.  Hacer un ensayo sobre la partida al hospital.  [place]ohman clases prenatales con el padre para entender, practicar y hacer preguntas sobre el [place] de parto y el alumbramiento.  Preparar la habitacin del beb / busque Fatima Blank.  No viaje fuera de la ciudad a menos que sea absolutamente necesario y con el asesoramiento de su mdico.  Use slo zapatos de tacn bajo o sin tacn para tener mejor equilibrio y Automotive engineer cadas. USO DE MEDICAMENTOS Y CONSUMO DE DROGAS DURANTE EL [facility]   Tome las vitaminas apropiadas para esta etapa tal como se le indic. Las vitaminas deben contener un miligramo de cido flico. Guarde todas las vitaminas fuera del alcance de los nios. La ingestin de slo un par de vitaminas o tabletas que contengan hierro pueden ocasionar la Newmont Mining en un beb o en un nio pequeo.  Evite el uso de [employer] Mutual [employer] Omaha, incluyendo  hierbas, medicamentos de [place], sin receta o que no hayan sido sugeridos por su mdico. Slo tome medicamentos de venta libre o medicamentos recetados para Chief Technology Officer, Environmental health practitioner o fiebre como lo indique su mdico. No tome aspirina, ibuprofeno (Motrin, Advil, Nuprin) o naproxeno (Aleve) excepto que su mdico se lo indique.  Infrmele al profesional si consume alguna droga.  El alcohol se relaciona con ciertos defectos congnitos. Incluye el sndrome de alcoholismo fetal. Debe evitar absolutamente el consumo de alcohol, en cualquier forma. El fumar produce baja tasa de natalidad y bebs prematuros.  Las drogas ilegales o de la calle son muy perjudiciales para el beb. Estn absolutamente prohibidas. Un beb que nace de American Express, ser adicto al nacer. Ese beb tendr los mismos sntomas de abstinencia que un adulto. SOLICITE ATENCIN MDICA SI:  Tiene preguntas o preocupaciones relacionadas con el embarazo. Es mejor que llame para formular las preguntas si no puede esperar hasta la prxima visita, que sentirse preocupada por ellas.  DECISIONES ACERCA DE LA CIRCUNCISIN  Usted puede saber o no cul es el sexo de su beb. Si ya sabe que ser un varn, este es el momento de pensar acerca de la circuncisin. La circuncisin es la extirpacin del prepucio. Esta es la piel que cubre el extremo sensible del pene. No hay un motivo mdico que lo justifique. Generalmente la decisin se toma segn lo que sea popular en ese momento, o segn creencias religiosas. Podr conversar estos temas con su mdico o con el pediatra.  SOLICITE ATENCIN MDICA DE INMEDIATO SI:   La temperatura oral le sube a ms de 102 F (38.9 C) o lo que su mdico le indique.  Tiene una prdida de lquido por la vagina (canal de parto). Si sospecha una ruptura de las [place], tmese la temperatura y llame al profesional para informarlo sobre esto.  Observa unas pequeas manchas, una hemorragia vaginal o elimina cogulos.  Notifique al profesional acerca de la cantidad y de cuntos apsitos est utilizando.  Presenta un olor desagradable en la secrecin vaginal y observa un cambio en el color, de transparente a blanco.  Ha vomitado durante ms de 24 horas.  Siente escalofros o le sube la fiebre.  Le falta el aire.  Siente ardor al Beatrix Shipper.  Baja o sube ms de 2 libras (900 g), o segn lo indicado por el profesional que la asiste.  Observa que sbitamente se le hinchan el rostro, las manos, los pies o las piernas.  Siente dolor en el vientre (abdominal). Las [employer]ogul en el ligamento redondo son Neomia Dear causa benigna frecuente de dolor abdominal durante el embarazo. El profesional que la asiste deber evaluarla.  Presenta dolor de cabeza intenso que no se [place].  Tiene problemas visuales, visin doble o borrosa.  Si no siente los movimientos del beb durante ms de 1 hora. Si piensa que el beb no se mueve tanto como lo haca habitualmente, coma algo que Psychologist, clinical y Target Corporation lado izquierdo durante [place]. El beb debe moverse al menos 4  5 veces por hora. Comunquese inmediatamente si el beb se mueve menos que lo indicado.  Se cae, se ve involucrada en un accidente automovilstico o sufre algn tipo de traumatismo.  En su hogar hay violencia mental o fsica. Document Released: 05/05/2005 Document Revised: 01/25/2012 [facility] Patient Information 2013 [place], [place].

## 2012-11-20 NOTE — Progress Notes (Signed)
Pain in lower pelvic area.

## 2012-11-20 NOTE — Progress Notes (Signed)
Reports good fm. Denies lof, vb, urinary frequency, urgency, hesitancy, or dysuria.  Reports lower pelvic cramping x 1 wk, as well as increased discharge.  Spec exam: mod amount white frothy malodorous d/c, SVE: LTC -3. Wet prep+ clues, no trich.  Rx Flagyl for BV. Discussed weight gain and recommended to decrease carbs. Discussed PN2 results. Reviewed ptl warning signs and fetal kick counts. All questions answered.

## 2012-12-04 ENCOUNTER — Encounter: Payer: Self-pay | Admitting: Women's Health

## 2012-12-04 ENCOUNTER — Ambulatory Visit (INDEPENDENT_AMBULATORY_CARE_PROVIDER_SITE_OTHER): Payer: Self-pay | Admitting: Women's Health

## 2012-12-04 VITALS — BP 140/62 | Wt 166.0 lb

## 2012-12-04 DIAGNOSIS — Z3403 Encounter for supervision of normal first pregnancy, third trimester: Secondary | ICD-10-CM

## 2012-12-04 DIAGNOSIS — Z3400 Encounter for supervision of normal first pregnancy, unspecified trimester: Secondary | ICD-10-CM

## 2012-12-04 DIAGNOSIS — Z1389 Encounter for screening for other disorder: Secondary | ICD-10-CM

## 2012-12-04 DIAGNOSIS — Z331 Pregnant state, incidental: Secondary | ICD-10-CM

## 2012-12-04 LAB — POCT URINALYSIS DIPSTICK
Blood, UA: NEGATIVE
Nitrite, UA: NEGATIVE
Protein, UA: NEGATIVE

## 2012-12-04 NOTE — Progress Notes (Signed)
Pain in lower belly.

## 2012-12-04 NOTE — Progress Notes (Signed)
Reports good fm. Denies uc's, lof, vb, urinary frequency, urgency, hesitancy, or dysuria.  Denies ha, scotomata, ruq/epigastric pain, n/v.  BP recheck 120/58. No complaints.  Reviewed ptl s/s, pre-e s/s, fetal kick counts. All questions answered. F/U 1 wk for visit/bp check.

## 2012-12-04 NOTE — Patient Instructions (Signed)
Pregnancy - Third Trimester  The third trimester of pregnancy (the last 3 months) is a period of the most rapid growth for you and your baby. The baby approaches a length of 20 inches and a weight of 6 to 10 pounds. The baby is adding on fat and getting ready for life outside your body. While inside, babies have periods of sleeping and waking, suck their thumbs, and hiccups. You can often feel small contractions of the uterus. This is false labor. It is also called Braxton-Hicks contractions. This is like a practice for labor. The usual problems in this stage of pregnancy include more difficulty breathing, swelling of the hands and feet from water retention, and having to urinate more often because of the uterus and baby pressing on your bladder.   PRENATAL EXAMS  · Blood work may continue to be done during prenatal exams. These tests are done to check on your health and the probable health of your baby. Blood work is used to follow your blood levels (hemoglobin). Anemia (low hemoglobin) is common during pregnancy. Iron and vitamins are given to help prevent this. You may also continue to be checked for diabetes. Some of the past blood tests may be done again.  · The size of the uterus is measured during each visit. This makes sure your baby is growing properly according to your pregnancy dates.  · Your blood pressure is checked every prenatal visit. This is to make sure you are not getting toxemia.  · Your urine is checked every prenatal visit for infection, diabetes and protein.  · Your weight is checked at each visit. This is done to make sure gains are happening at the suggested rate and that you and your baby are growing normally.  · Sometimes, an ultrasound is performed to confirm the position and the proper growth and development of the baby. This is a test done that bounces harmless sound waves off the baby so your caregiver can more accurately determine due dates.  · Discuss the type of pain medication and  anesthesia you will have during your labor and delivery.  · Discuss the possibility and anesthesia if a Cesarean Section might be necessary.  · Inform your caregiver if there is any mental or physical violence at home.  Sometimes, a specialized non-stress test, contraction stress test and biophysical profile are done to make sure the baby is not having a problem. Checking the amniotic fluid surrounding the baby is called an amniocentesis. The amniotic fluid is removed by sticking a needle into the belly (abdomen). This is sometimes done near the end of pregnancy if an early delivery is required. In this case, it is done to help make sure the baby's lungs are mature enough for the baby to live outside of the womb. If the lungs are not mature and it is unsafe to deliver the baby, an injection of cortisone medication is given to the mother 1 to 2 days before the delivery. This helps the baby's lungs mature and makes it safer to deliver the baby.  CHANGES OCCURING IN THE THIRD TRIMESTER OF PREGNANCY  Your body goes through many changes during pregnancy. They vary from person to person. Talk to your caregiver about changes you notice and are concerned about.  · During the last trimester, you have probably had an increase in your appetite. It is normal to have cravings for certain foods. This varies from person to person and pregnancy to pregnancy.  · You may begin to   get stretch marks on your hips, abdomen, and breasts. These are normal changes in the body during pregnancy. There are no exercises or medications to take which prevent this change.  · Constipation may be treated with a stool softener or adding bulk to your diet. Drinking lots of fluids, fiber in vegetables, fruits, and whole grains are helpful.  · Exercising is also helpful. If you have been very active up until your pregnancy, most of these activities can be continued during your pregnancy. If you have been less active, it is helpful to start an exercise  program such as walking. Consult your caregiver before starting exercise programs.  · Avoid all smoking, alcohol, un-prescribed drugs, herbs and "street drugs" during your pregnancy. These chemicals affect the formation and growth of the baby. Avoid chemicals throughout the pregnancy to ensure the delivery of a healthy infant.  · Backache, varicose veins and hemorrhoids may develop or get worse.  · You will tire more easily in the third trimester, which is normal.  · The baby's movements may be stronger and more often.  · You may become short of breath easily.  · Your belly button may stick out.  · A yellow discharge may leak from your breasts called colostrum.  · You may have a bloody mucus discharge. This usually occurs a few days to a week before labor begins.  HOME CARE INSTRUCTIONS   · Keep your caregiver's appointments. Follow your caregiver's instructions regarding medication use, exercise, and diet.  · During pregnancy, you are providing food for you and your baby. Continue to eat regular, well-balanced meals. Choose foods such as meat, fish, milk and other low fat dairy products, vegetables, fruits, and whole-grain breads and cereals. Your caregiver will tell you of the ideal weight gain.  · A physical sexual relationship may be continued throughout pregnancy if there are no other problems such as early (premature) leaking of amniotic fluid from the membranes, vaginal bleeding, or belly (abdominal) pain.  · Exercise regularly if there are no restrictions. Check with your caregiver if you are unsure of the safety of your exercises. Greater weight gain will occur in the last 2 trimesters of pregnancy. Exercising helps:  · Control your weight.  · Get you in shape for labor and delivery.  · You lose weight after you deliver.  · Rest a lot with legs elevated, or as needed for leg cramps or low back pain.  · Wear a good support or jogging bra for breast tenderness during pregnancy. This may help if worn during  sleep. Pads or tissues may be used in the bra if you are leaking colostrum.  · Do not use hot tubs, steam rooms, or saunas.  · Wear your seat belt when driving. This protects you and your baby if you are in an accident.  · Avoid raw meat, cat litter boxes and soil used by cats. These carry germs that can cause birth defects in the baby.  · It is easier to loose urine during pregnancy. Tightening up and strengthening the pelvic muscles will help with this problem. You can practice stopping your urination while you are going to the bathroom. These are the same muscles you need to strengthen. It is also the muscles you would use if you were trying to stop from passing gas. You can practice tightening these muscles up 10 times a set and repeating this about 3 times per day. Once you know what muscles to tighten up, do not perform these   exercises during urination. It is more likely to cause an infection by backing up the urine.  · Ask for help if you have financial, counseling or nutritional needs during pregnancy. Your caregiver will be able to offer counseling for these needs as well as refer you for other special needs.  · Make a list of emergency phone numbers and have them available.  · Plan on getting help from family or friends when you go home from the hospital.  · Make a trial run to the hospital.  · Take prenatal classes with the father to understand, practice and ask questions about the labor and delivery.  · Prepare the baby's room/nursery.  · Do not travel out of the city unless it is absolutely necessary and with the advice of your caregiver.  · Wear only low or no heal shoes to have better balance and prevent falling.  MEDICATIONS AND DRUG USE IN PREGNANCY  · Take prenatal vitamins as directed. The vitamin should contain 1 milligram of folic acid. Keep all vitamins out of reach of children. Only a couple vitamins or tablets containing iron may be fatal to a baby or young child when ingested.  · Avoid use  of all medications, including herbs, over-the-counter medications, not prescribed or suggested by your caregiver. Only take over-the-counter or prescription medicines for pain, discomfort, or fever as directed by your caregiver. Do not use aspirin, ibuprofen (Motrin®, Advil®, Nuprin®) or naproxen (Aleve®) unless OK'd by your caregiver.  · Let your caregiver also know about herbs you may be using.  · Alcohol is related to a number of birth defects. This includes fetal alcohol syndrome. All alcohol, in any form, should be avoided completely. Smoking will cause low birth rate and premature babies.  · Street/illegal drugs are very harmful to the baby. They are absolutely forbidden. A baby born to an addicted mother will be addicted at birth. The baby will go through the same withdrawal an adult does.  SEEK MEDICAL CARE IF:  You have any concerns or worries during your pregnancy. It is better to call with your questions if you feel they cannot wait, rather than worry about them.  DECISIONS ABOUT CIRCUMCISION  You may or may not know the sex of your baby. If you know your baby is a boy, it may be time to think about circumcision. Circumcision is the removal of the foreskin of the penis. This is the skin that covers the sensitive end of the penis. There is no proven medical need for this. Often this decision is made on what is popular at the time or based upon religious beliefs and social issues. You can discuss these issues with your caregiver or pediatrician.  SEEK IMMEDIATE MEDICAL CARE IF:   · An unexplained oral temperature above 102° F (38.9° C) develops, or as your caregiver suggests.  · You have leaking of fluid from the vagina (birth canal). If leaking membranes are suspected, take your temperature and tell your caregiver of this when you call.  · There is vaginal spotting, bleeding or passing clots. Tell your caregiver of the amount and how many pads are used.  · You develop a bad smelling vaginal discharge with  a change in the color from clear to white.  · You develop vomiting that lasts more than 24 hours.  · You develop chills or fever.  · You develop shortness of breath.  · You develop burning on urination.  · You loose more than 2 pounds of weight   or gain more than 2 pounds of weight or as suggested by your caregiver.  · You notice sudden swelling of your face, hands, and feet or legs.  · You develop belly (abdominal) pain. Round ligament discomfort is a common non-cancerous (benign) cause of abdominal pain in pregnancy. Your caregiver still must evaluate you.  · You develop a severe headache that does not go away.  · You develop visual problems, blurred or double vision.  · If you have not felt your baby move for more than 1 hour. If you think the baby is not moving as much as usual, eat something with sugar in it and lie down on your left side for an hour. The baby should move at least 4 to 5 times per hour. Call right away if your baby moves less than that.  · You fall, are in a car accident or any kind of trauma.  · There is mental or physical violence at home.  Document Released: 07/20/2001 Document Revised: 10/18/2011 Document Reviewed: 01/22/2009  [employer]® Patient Information ©2013 [employer].

## 2012-12-13 ENCOUNTER — Ambulatory Visit (INDEPENDENT_AMBULATORY_CARE_PROVIDER_SITE_OTHER): Payer: Self-pay | Admitting: Advanced Practice Midwife

## 2012-12-13 ENCOUNTER — Encounter: Payer: Self-pay | Admitting: Advanced Practice Midwife

## 2012-12-13 VITALS — BP 116/70 | Wt 167.0 lb

## 2012-12-13 DIAGNOSIS — Z3400 Encounter for supervision of normal first pregnancy, unspecified trimester: Secondary | ICD-10-CM

## 2012-12-13 DIAGNOSIS — Z331 Pregnant state, incidental: Secondary | ICD-10-CM

## 2012-12-13 DIAGNOSIS — Z1389 Encounter for screening for other disorder: Secondary | ICD-10-CM

## 2012-12-13 LAB — POCT URINALYSIS DIPSTICK: Glucose, UA: NEGATIVE

## 2012-12-13 NOTE — Progress Notes (Signed)
No c/o at this time.  Routine questions about pregnancy answered.  F/U in 1 weeks for LROB.  

## 2012-12-22 ENCOUNTER — Encounter: Payer: Self-pay | Admitting: Obstetrics & Gynecology

## 2012-12-22 ENCOUNTER — Ambulatory Visit (INDEPENDENT_AMBULATORY_CARE_PROVIDER_SITE_OTHER): Payer: Self-pay | Admitting: Obstetrics & Gynecology

## 2012-12-22 VITALS — BP 110/60 | Wt 170.0 lb

## 2012-12-22 DIAGNOSIS — Z331 Pregnant state, incidental: Secondary | ICD-10-CM

## 2012-12-22 DIAGNOSIS — Z3403 Encounter for supervision of normal first pregnancy, third trimester: Secondary | ICD-10-CM

## 2012-12-22 DIAGNOSIS — Z3400 Encounter for supervision of normal first pregnancy, unspecified trimester: Secondary | ICD-10-CM

## 2012-12-22 DIAGNOSIS — Z1389 Encounter for screening for other disorder: Secondary | ICD-10-CM

## 2012-12-22 LAB — POCT URINALYSIS DIPSTICK
Glucose, UA: NEGATIVE
Ketones, UA: NEGATIVE
Protein, UA: NEGATIVE

## 2012-12-22 NOTE — Progress Notes (Signed)
BP weight and urine results all reviewed and noted. Patient reports good fetal movement, denies any bleeding and no rupture of membranes symptoms or regular contractions. Patient is without complaints. All questions were answered. GBS done 

## 2012-12-22 NOTE — Patient Instructions (Signed)
Normal Labor and Delivery  Your caregiver must first be sure you are in labor. Signs of labor include:  · You may pass what is called "the mucus plug" before labor begins. This is a small amount of blood stained mucus.  · Regular uterine contractions.  · The time between contractions get closer together.  · The discomfort and pain gradually gets more intense.  · Pains are mostly located in the back.  · Pains get worse when walking.  · The cervix (the opening of the uterus becomes thinner (begins to efface) and opens up (dilates).  Once you are in labor and admitted into the hospital or care center, your caregiver will do the following:  · A complete physical examination.  · Check your vital signs (blood pressure, pulse, temperature and the fetal heart rate).  · Do a vaginal examination (using a sterile glove and lubricant) to determine:  · The position (presentation) of the baby (head [vertex] or buttock first).  · The level (station) of the baby's head in the birth canal.  · The effacement and dilatation of the cervix.  · You may have your pubic hair shaved and be given an enema depending on your caregiver and the circumstance.  · An electronic monitor is usually placed on your abdomen. The monitor follows the length and intensity of the contractions, as well as the baby's heart rate.  · Usually, your caregiver will insert an IV in your arm with a bottle of sugar water. This is done as a precaution so that medications can be given to you quickly during labor or delivery.  NORMAL LABOR AND DELIVERY IS DIVIDED UP INTO 3 STAGES:  First Stage  This is when regular contractions begin and the cervix begins to efface and dilate. This stage can last from 3 to 15 hours. The end of the first stage is when the cervix is 100% effaced and 10 centimeters dilated. Pain medications may be given by   · Injection (morphine, demerol, etc.)  · Regional anesthesia (spinal, caudal or epidural, anesthetics given in different locations of  the spine). Paracervical pain medication may be given, which is an injection of and anesthetic on each side of the cervix.  A pregnant woman may request to have "Natural Childbirth" which is not to have any medications or anesthesia during her labor and delivery.  Second Stage  This is when the baby comes down through the birth canal (vagina) and is born. This can take 1 to 4 hours. As the baby's head comes down through the birth canal, you may feel like you are going to have a bowel movement. You will get the urge to bear down and push until the baby is delivered. As the baby's head is being delivered, the caregiver will decide if an episiotomy (a cut in the perineum and vagina area) is needed to prevent tearing of the tissue in this area. The episiotomy is sewn up after the delivery of the baby and placenta. Sometimes a mask with nitrous oxide is given for the mother to breath during the delivery of the baby to help if there is too much pain. The end of Stage 2 is when the baby is fully delivered. Then when the umbilical cord stops pulsating it is clamped and cut.  Third Stage  The third stage begins after the baby is completely delivered and ends after the placenta (afterbirth) is delivered. This usually takes 5 to 30 minutes. After the placenta is delivered, a medication   is given either by intravenous or injection to help contract the uterus and prevent bleeding. The third stage is not painful and pain medication is usually not necessary. If an episiotomy was done, it is repaired at this time.  After the delivery, the mother is watched and monitored closely for 1 to 2 hours to make sure there is no postpartum bleeding (hemorrhage). If there is a lot of bleeding, medication is given to contract the uterus and stop the bleeding.  Document Released: 05/04/2008 Document Revised: 10/18/2011 Document Reviewed: 05/04/2008  [employer]® Patient Information ©2013 [employer].

## 2012-12-24 LAB — STREP B DNA PROBE: GBSP: NEGATIVE

## 2012-12-25 LAB — GC/CHLAMYDIA PROBE AMP: CT Probe RNA: NEGATIVE

## 2012-12-27 ENCOUNTER — Encounter: Payer: Self-pay | Admitting: Advanced Practice Midwife

## 2012-12-28 ENCOUNTER — Ambulatory Visit (INDEPENDENT_AMBULATORY_CARE_PROVIDER_SITE_OTHER): Payer: Self-pay | Admitting: Advanced Practice Midwife

## 2012-12-28 ENCOUNTER — Encounter: Payer: Self-pay | Admitting: Advanced Practice Midwife

## 2012-12-28 VITALS — BP 120/70 | Wt 179.0 lb

## 2012-12-28 DIAGNOSIS — Z1389 Encounter for screening for other disorder: Secondary | ICD-10-CM

## 2012-12-28 DIAGNOSIS — Z3400 Encounter for supervision of normal first pregnancy, unspecified trimester: Secondary | ICD-10-CM

## 2012-12-28 DIAGNOSIS — Z331 Pregnant state, incidental: Secondary | ICD-10-CM

## 2012-12-28 DIAGNOSIS — Z3403 Encounter for supervision of normal first pregnancy, third trimester: Secondary | ICD-10-CM

## 2012-12-28 LAB — POCT URINALYSIS DIPSTICK
Leukocytes, UA: NEGATIVE
Nitrite, UA: NEGATIVE
Protein, UA: NEGATIVE

## 2012-12-28 NOTE — Progress Notes (Signed)
No c/o at this time.  Routine questions about pregnancy answered.  F/U in 1 weeks for LROB.  

## 2013-01-04 ENCOUNTER — Encounter: Payer: Self-pay | Admitting: Advanced Practice Midwife

## 2013-01-05 ENCOUNTER — Ambulatory Visit (INDEPENDENT_AMBULATORY_CARE_PROVIDER_SITE_OTHER): Payer: Self-pay | Admitting: Obstetrics and Gynecology

## 2013-01-05 ENCOUNTER — Encounter: Payer: Self-pay | Admitting: Obstetrics and Gynecology

## 2013-01-05 VITALS — BP 128/82

## 2013-01-05 DIAGNOSIS — Z1389 Encounter for screening for other disorder: Secondary | ICD-10-CM

## 2013-01-05 DIAGNOSIS — O99019 Anemia complicating pregnancy, unspecified trimester: Secondary | ICD-10-CM

## 2013-01-05 DIAGNOSIS — Z3483 Encounter for supervision of other normal pregnancy, third trimester: Secondary | ICD-10-CM

## 2013-01-05 LAB — POCT URINALYSIS DIPSTICK
Leukocytes, UA: NEGATIVE
Nitrite, UA: NEGATIVE
Protein, UA: NEGATIVE

## 2013-01-05 NOTE — Progress Notes (Signed)
Pt here today for routine visit. Pt denies any problems at this time. Pt states she has good baby movement IMP: Routine pregnancy [redacted]w[redacted]d jvf

## 2013-01-05 NOTE — Patient Instructions (Signed)
Evaluacin de los movimientos fetales  (Fetal Movement Counts) Nombre del paciente: __________________________________________________ Fecha de parto estimada: ____________________ La evaluacin de los movimientos fetales es muy recomendable en los embarazos de alto riesgo, pero tambin es una buena idea que lo hagan todas las embarazadas. El mdico le indicar que comience a contarlos a las 28 semanas de embarazo. Los movimientos fetales suelen aumentar:   Despus de una comida completa.  Despus de la actividad fsica.  Despus de comer o beber algo dulce o fro.  En reposo. Preste atencin cuando sienta que el beb est ms activo. Esto le ayudar a notar un patrn de ciclos de vigilia y sueo de su beb y cules son los factores que contribuyen a un aumento de los movimientos fetales. Es importante llevar a cabo un recuento de movimientos fetales, al mismo tiempo cada da, cuando el beb normalmente est ms activo.  CMO CONTAR LOS MOVIMIENTOS FETALES 1. Busque un lugar tranquilo y cmodo para sentarse o recostarse sobre el lado izquierdo. Al recostarse sobre su lado izquierdo, le proporciona una mejor circulacin de sangre y oxgeno al beb. 2. Anote el da y la hora en una hoja de papel o en un diario. 3. Comience contando las pataditas, revoloteos, chasquidos, vueltas o pinchazos en un perodo de 2 horas. Debe sentir al menos 10 movimientos en 2 horas. 4. Si no siente 10 movimientos en 2 horas, espere 2  3 horas y cuente de nuevo. Busque cambios en el patrn o si no cuenta lo suficiente en 2 horas. SOLICITE ATENCIN MDICA SI:   Siente menos de 10 pataditas en 2 horas, en dos intentos.  No hay movimientos durante una hora.  El patrn se modifica o le lleva ms tiempo cada da contar las 10 pataditas.  Siente que el beb no se mueve como lo hace habitualmente. Fecha: ____________ Movimientos: ____________ Hora de inicio: ____________ Hora de finalizacin: ____________  Fecha:  ____________ Movimientos: ____________ Hora de inicio: ____________ Hora de finalizacin: ____________  Fecha: ____________ Movimientos: ____________ Hora de inicio: ____________ Hora de finalizacin: ____________  Fecha: ____________ Movimientos: ____________ Hora de inicio: ____________ Hora de finalizacin: ____________  Fecha: ____________ Movimientos: ____________ Hora de inicio: ____________ Hora de finalizacin: ____________  Fecha: ____________ Movimientos: ____________ Hora de inicio: ____________ Hora de finalizacin: ____________  Fecha: ____________ Movimientos: ____________ Hora de inicio: ____________ Hora de finalizacin: ____________  Fecha: ____________ Movimientos: ____________ Hora de inicio: ____________ Hora de finalizacin: ____________  Fecha: ____________ Movimientos: ____________ Hora de inicio: ____________ Hora de finalizacin: ____________  Fecha: ____________ Movimientos: ____________ Hora de inicio: ____________ Hora de finalizacin: ____________  Fecha: ____________ Movimientos: ____________ Hora de inicio: ____________ Hora de finalizacin: ____________  Fecha: ____________ Movimientos: ____________ Hora de inicio: ____________ Hora de finalizacin: ____________  Fecha: ____________ Movimientos: ____________ Hora de inicio: ____________ Hora de finalizacin: ____________  Fecha: ____________ Movimientos: ____________ Hora de inicio: ____________ Hora de finalizacin: ____________  Fecha: ____________ Movimientos: ____________ Hora de inicio: ____________ Hora de finalizacin: ____________  Fecha: ____________ Movimientos: ____________ Hora de inicio: ____________ Hora de finalizacin: ____________  Fecha: ____________ Movimientos: ____________ Hora de inicio: ____________ Hora de finalizacin: ____________  Fecha: ____________ Movimientos: ____________ Hora de inicio: ____________ Hora de finalizacin: ____________  Fecha: ____________ Movimientos: ____________ Hora  de inicio: ____________ Hora de finalizacin: ____________  Fecha: ____________ Movimientos: ____________ Hora de inicio: ____________ Hora de finalizacin: ____________  Fecha: ____________ Movimientos: ____________ Hora de inicio: ____________ Hora de finalizacin: ____________  Fecha: ____________ Movimientos: ____________ Hora de inicio: ____________ Hora de   finalizacin: ____________  Fecha: ____________ Movimientos: ____________ Hora de inicio: ____________ Hora de finalizacin: ____________  Fecha: ____________ Movimientos: ____________ Hora de inicio: ____________ Hora de finalizacin: ____________  Fecha: ____________ Movimientos: ____________ Hora de inicio: ____________ Hora de finalizacin: ____________  Fecha: ____________ Movimientos: ____________ Hora de inicio: ____________ Hora de finalizacin: ____________  Fecha: ____________ Movimientos: ____________ Hora de inicio: ____________ Hora de finalizacin: ____________  Fecha: ____________ Movimientos: ____________ Hora de inicio: ____________ Hora de finalizacin: ____________  Fecha: ____________ Movimientos: ____________ Hora de inicio: ____________ Hora de finalizacin: ____________  Fecha: ____________ Movimientos: ____________ Hora de inicio: ____________ Hora de finalizacin: ____________  Fecha: ____________ Movimientos: ____________ Hora de inicio: ____________ Hora de finalizacin: ____________  Fecha: ____________ Movimientos: ____________ Hora de inicio: ____________ Hora de finalizacin: ____________  Fecha: ____________ Movimientos: ____________ Hora de inicio: ____________ Hora de finalizacin: ____________  Fecha: ____________ Movimientos: ____________ Hora de inicio: ____________ Hora de finalizacin: ____________  Fecha: ____________ Movimientos: ____________ Hora de inicio: ____________ Hora de finalizacin: ____________  Fecha: ____________ Movimientos: ____________ Hora de inicio: ____________ Hora de finalizacin:  ____________  Fecha: ____________ Movimientos: ____________ Hora de inicio: ____________ Hora de finalizacin: ____________  Fecha: ____________ Movimientos: ____________ Hora de inicio: ____________ Hora de finalizacin: ____________  Fecha: ____________ Movimientos: ____________ Hora de inicio: ____________ Hora de finalizacin: ____________  Fecha: ____________ Movimientos: ____________ Hora de inicio: ____________ Hora de finalizacin: ____________  Fecha: ____________ Movimientos: ____________ Hora de inicio: ____________ Hora de finalizacin: ____________  Fecha: ____________ Movimientos: ____________ Hora de inicio: ____________ Hora de finalizacin: ____________  Fecha: ____________ Movimientos: ____________ Hora de inicio: ____________ Hora de finalizacin: ____________  Fecha: ____________ Movimientos: ____________ Hora de inicio: ____________ Hora de finalizacin: ____________  Fecha: ____________ Movimientos: ____________ Hora de inicio: ____________ Hora de finalizacin: ____________  Fecha: ____________ Movimientos: ____________ Hora de inicio: ____________ Hora de finalizacin: ____________  Fecha: ____________ Movimientos: ____________ Hora de inicio: ____________ Hora de finalizacin: ____________  Fecha: ____________ Movimientos: ____________ Hora de inicio: ____________ Hora de finalizacin: ____________  Fecha: ____________ Movimientos: ____________ Hora de inicio: ____________ Hora de finalizacin: ____________  Fecha: ____________ Movimientos: ____________ Hora de inicio: ____________ Hora de finalizacin: ____________  Fecha: ____________ Movimientos: ____________ Hora de inicio: ____________ Hora de finalizacin: ____________  Fecha: ____________ Movimientos: ____________ Hora de inicio: ____________ Hora de finalizacin: ____________  Fecha: ____________ Movimientos: ____________ Hora de inicio: ____________ Hora de finalizacin: ____________  Fecha: ____________  Movimientos: ____________ Hora de inicio: ____________ Hora de finalizacin: ____________  Fecha: ____________ Movimientos: ____________ Hora de inicio: ____________ Hora de finalizacin: ____________  Fecha: ____________ Movimientos: ____________ Hora de inicio: ____________ Hora de finalizacin: ____________  Document Released: 11/02/2007 Document Revised: 07/12/2012 [employer] Patient Information 2014 [employer].  

## 2013-01-16 ENCOUNTER — Telehealth (HOSPITAL_COMMUNITY): Payer: Self-pay | Admitting: *Deleted

## 2013-01-16 ENCOUNTER — Ambulatory Visit (INDEPENDENT_AMBULATORY_CARE_PROVIDER_SITE_OTHER): Payer: Self-pay | Admitting: Advanced Practice Midwife

## 2013-01-16 VITALS — BP 128/70 | Wt 178.0 lb

## 2013-01-16 DIAGNOSIS — Z331 Pregnant state, incidental: Secondary | ICD-10-CM

## 2013-01-16 DIAGNOSIS — O99019 Anemia complicating pregnancy, unspecified trimester: Secondary | ICD-10-CM

## 2013-01-16 DIAGNOSIS — Z3400 Encounter for supervision of normal first pregnancy, unspecified trimester: Secondary | ICD-10-CM

## 2013-01-16 DIAGNOSIS — Z1389 Encounter for screening for other disorder: Secondary | ICD-10-CM

## 2013-01-16 LAB — POCT URINALYSIS DIPSTICK
Blood, UA: NEGATIVE
Nitrite, UA: NEGATIVE

## 2013-01-16 NOTE — Progress Notes (Signed)
No c/o at this time.  Routine questions about pregnancy answered. IOL scheduled for Monday 6/16 at 7:30 pm if undelivered.

## 2013-01-16 NOTE — Progress Notes (Signed)
C/o "some abdominal pain," had contractions this past Friday and Saturday.

## 2013-01-16 NOTE — Telephone Encounter (Signed)
Preadmission screen  

## 2013-01-16 NOTE — Patient Instructions (Addendum)
Induction of labor scheduled at [facility] ([address], [place], [place]) on Monday, January 22, 2013 at 7:30pm, if you are still pregnant!  Register at the Maternity Admissions Unit.  Eat a light dinner before you come.

## 2013-01-22 ENCOUNTER — Inpatient Hospital Stay (HOSPITAL_COMMUNITY)
Admission: RE | Admit: 2013-01-22 | Discharge: 2013-01-27 | DRG: 765 | Disposition: A | Discharge status: Home / Self Care | Payer: Medicaid Other | Source: Ambulatory Visit | Attending: Obstetrics & Gynecology | Admitting: Obstetrics & Gynecology

## 2013-01-22 ENCOUNTER — Inpatient Hospital Stay (HOSPITAL_COMMUNITY): Admission: AD | Admit: 2013-01-22 | Payer: Self-pay | Source: Ambulatory Visit

## 2013-01-22 VITALS — BP 133/76 | HR 74 | Temp 98.2°F | Resp 18 | Ht 62.0 in | Wt 178.0 lb

## 2013-01-22 DIAGNOSIS — B9689 Other specified bacterial agents as the cause of diseases classified elsewhere: Secondary | ICD-10-CM

## 2013-01-22 DIAGNOSIS — Z3403 Encounter for supervision of normal first pregnancy, third trimester: Secondary | ICD-10-CM

## 2013-01-22 DIAGNOSIS — N760 Acute vaginitis: Secondary | ICD-10-CM

## 2013-01-22 DIAGNOSIS — O480 Post-term pregnancy: Principal | ICD-10-CM | POA: Diagnosis present

## 2013-01-22 DIAGNOSIS — O411090 Infection of amniotic sac and membranes, unspecified, unspecified trimester, not applicable or unspecified: Secondary | ICD-10-CM | POA: Diagnosis present

## 2013-01-22 LAB — CBC
HCT: 33.7 % — ABNORMAL LOW (ref 36.0–46.0)
MCV: 87.1 fL (ref 78.0–100.0)
RBC: 3.87 MIL/uL (ref 3.87–5.11)
RDW: 15.5 % (ref 11.5–15.5)
WBC: 7.3 10*3/uL (ref 4.0–10.5)

## 2013-01-22 MED ORDER — ONDANSETRON HCL 4 MG/2ML IJ SOLN
4.0000 mg | Freq: Four times a day (QID) | INTRAMUSCULAR | Status: DC | PRN
  Filled 2013-01-22: qty 2

## 2013-01-22 MED ORDER — ACETAMINOPHEN 325 MG PO TABS
650.0000 mg | ORAL_TABLET | ORAL | Status: DC | PRN
  Administered 2013-01-24 (×2): 650 mg via ORAL
  Filled 2013-01-22 (×2): qty 2

## 2013-01-22 MED ORDER — CITRIC ACID-SODIUM CITRATE 334-500 MG/5ML PO SOLN
30.0000 mL | ORAL | Status: DC | PRN
  Administered 2013-01-24: 30 mL via ORAL
  Filled 2013-01-22: qty 15

## 2013-01-22 MED ORDER — MISOPROSTOL 25 MCG QUARTER TABLET
25.0000 ug | ORAL_TABLET | Freq: Once | ORAL | Status: AC
  Administered 2013-01-22: 25 ug via VAGINAL
  Filled 2013-01-22: qty 0.25

## 2013-01-22 MED ORDER — LACTATED RINGERS IV SOLN
500.0000 mL | INTRAVENOUS | Status: DC | PRN
  Administered 2013-01-23: 500 mL via INTRAVENOUS

## 2013-01-22 MED ORDER — LIDOCAINE HCL (PF) 1 % IJ SOLN
30.0000 mL | INTRAMUSCULAR | Status: DC | PRN
  Filled 2013-01-22: qty 30

## 2013-01-22 MED ORDER — OXYCODONE-ACETAMINOPHEN 5-325 MG PO TABS: 1.0000 | ORAL_TABLET | ORAL | Status: DC | PRN

## 2013-01-22 MED ORDER — TERBUTALINE SULFATE 1 MG/ML IJ SOLN: 0.2500 mg | Freq: Once | INTRAMUSCULAR | Status: AC | PRN

## 2013-01-22 MED ORDER — LACTATED RINGERS IV SOLN
INTRAVENOUS | Status: DC
  Administered 2013-01-22: 20:00:00 via INTRAVENOUS
  Administered 2013-01-23: 125 mL/h via INTRAVENOUS
  Administered 2013-01-23 (×2): via INTRAVENOUS
  Administered 2013-01-24: 125 mL/h via INTRAVENOUS
  Administered 2013-01-24: 14:00:00 via INTRAVENOUS

## 2013-01-22 MED ORDER — OXYTOCIN 40 UNITS IN LACTATED RINGERS INFUSION - SIMPLE MED: 62.5000 mL/h | INTRAVENOUS | Status: DC

## 2013-01-22 MED ORDER — IBUPROFEN 600 MG PO TABS: 600.0000 mg | ORAL_TABLET | Freq: Four times a day (QID) | ORAL | Status: DC | PRN

## 2013-01-22 MED ORDER — FLEET ENEMA 7-19 GM/118ML RE ENEM: 1.0000 | ENEMA | RECTAL | Status: DC | PRN

## 2013-01-22 MED ORDER — OXYTOCIN BOLUS FROM INFUSION: 500.0000 mL | INTRAVENOUS | Status: DC

## 2013-01-22 NOTE — H&P (Signed)
Judith King is a 22 y.o. female [redacted]w[redacted]d G1P0 presents for induction of labor secondary to post-dates.  Patient denies any vaginal bleeding or fluid leakage.  Patient reports good fetal movement.  Patient is accompanied by her husband and mother-in-law.    Patient is Spanish-speaking.  Interpreter present during interview.   History OB History   Grav Para Term Preterm Abortions TAB SAB Ect Mult Living   1              Past Medical History  Diagnosis Date   Medical history non-contributory    Past Surgical History  Procedure Laterality Date   No past surgeries     Family History: family history includes Diabetes in her other. Social History:  reports that she has never smoked. She has never used smokeless tobacco. She reports that she does not drink alcohol or use illicit drugs.      Review of Systems  Constitutional: Negative.   HENT: Negative.   Respiratory: Negative.   Cardiovascular: Negative.   Gastrointestinal: Negative.   Genitourinary: Negative.   Skin: Negative.     Dilation: 1.5 Effacement (%): Thick Station: -3 Exam by:: dr. Jerelyn Scott Blood pressure 131/69, pulse 74, resp. rate 18, height 5\' 2"  (1.575 m), weight 80.74 kg (178 lb), last menstrual period 03/29/2012. Exam Physical Exam  Constitutional: She is oriented to person, place, and time. She appears well-developed and well-nourished.  HENT:  Head: Normocephalic and atraumatic.  Neck: Neck supple.  Cardiovascular: Normal rate, regular rhythm and normal heart sounds.   Respiratory: Effort normal and breath sounds normal.  Genitourinary: Vagina normal.  FHT - 150 bpm, + acels, moderate variability, no decels present.  UC - None  Neurological: She is alert and oriented to person, place, and time.    Prenatal labs: ABO, Rh: --/--/O POS (11/09 1130) Antibody: NEG (03/21 1119) Rubella: Immune (10/18 0000) RPR: NON REAC (03/21 1217)  HBsAg: Negative (10/18 0000)  HIV: NON REACTIVE (03/21  1217)  GBS: NEGATIVE (05/16 1016)   Assessment/Plan: 22 y.o. female [redacted]w[redacted]d G1P0 presents for induction of labor secondary to post-dates.  -Admit to [employer] -GBS negative. -Will place Cytotec to assist with induction.  -Will monitor throughout the evening.  -Anticipate NSVD.     [facility] 01/22/2013, 9:48 PM

## 2013-01-23 ENCOUNTER — Encounter (HOSPITAL_COMMUNITY): Payer: Self-pay

## 2013-01-23 LAB — TYPE AND SCREEN
ABO/RH(D): O POS
Antibody Screen: NEGATIVE

## 2013-01-23 MED ORDER — MISOPROSTOL 25 MCG QUARTER TABLET
25.0000 ug | ORAL_TABLET | ORAL | Status: DC
  Filled 2013-01-23: qty 0.25

## 2013-01-23 MED ORDER — TERBUTALINE SULFATE 1 MG/ML IJ SOLN: 0.2500 mg | Freq: Once | INTRAMUSCULAR | Status: AC | PRN

## 2013-01-23 MED ORDER — OXYTOCIN 40 UNITS IN LACTATED RINGERS INFUSION - SIMPLE MED
1.0000 m[IU]/min | INTRAVENOUS | Status: DC
  Administered 2013-01-23: 2 m[IU]/min via INTRAVENOUS
  Filled 2013-01-23: qty 1000

## 2013-01-23 MED ORDER — NALBUPHINE SYRINGE 5 MG/0.5 ML
10.0000 mg | INJECTION | INTRAMUSCULAR | Status: DC | PRN
  Administered 2013-01-23 (×5): 10 mg via INTRAVENOUS
  Filled 2013-01-23 (×5): qty 1

## 2013-01-23 NOTE — Progress Notes (Signed)
Judith King is a 22 y.o. G1P0000 at [redacted]w[redacted]d by ultrasound admitted for induction of labor due to Post dates. Due date 01/14/13.  Subjective: Patient comfortable. Foley bulb now out.  Objective: BP 122/70  Pulse 76  Temp(Src) 98.4 F (36.9 C) (Oral)  Resp 20  Ht 5\' 2"  (1.575 m)  Wt 80.74 kg (178 lb)  BMI 32.55 kg/m2  LMP 03/29/2012      FHT:  FHR: 135 bpm, variability: moderate,  accelerations:  Present,  decelerations:  Absent UC:   Irregular, Every 5 - 7+mins  SVE:   Dilation: 4 Effacement (%): 70 Station: -2 Exam by:: felkel,rn  Labs: Lab Results  Component Value Date   WBC 7.3 01/22/2013   HGB 11.4* 01/22/2013   HCT 33.7* 01/22/2013   MCV 87.1 01/22/2013   PLT 211 01/22/2013    Assessment / Plan: IOL due to postdates Foley bulb out  Labor: Progressing normally, Will start pitocin. Preeclampsia: none Fetal Wellbeing:  Category I Pain Control: IV nubain if needed Anticipated MOD:  NSVD  Everlene Other 01/23/2013, 3:16 PM

## 2013-01-23 NOTE — H&P (Signed)
Attestation of Attending Supervision of Advanced Practitioner (CNM/NP): Evaluation and management procedures were performed by the Advanced Practitioner under my supervision and collaboration.  I have reviewed the Advanced Practitioner's note and chart, and I agree with the management and plan.  HARRAWAY-SMITH, Cyle Kenyon 6:31 AM     

## 2013-01-23 NOTE — Progress Notes (Signed)
Judith King is a 22 y.o. G1P0 at [redacted]w[redacted]d presents with IOL secondary to post dates.    Subjective: Patient states her pain is relatively well controlled.  Husband at bedside.  Interpreter present.   Objective: BP 136/85   Pulse 71   Temp(Src) 97.9 F (36.6 C) (Oral)   Resp 18   Ht 5\' 2"  (1.575 m)   Wt 80.74 kg (178 lb)   BMI 32.55 kg/m2   LMP 03/29/2012      FHT:  FHR: 130 bpm, variability: moderate,  accelerations:  Present,  decelerations:  Absent UC:   regular, every 2-3 minutes SVE:   Dilation: 2 Effacement (%): 30 Station: -3 Exam by:: dr. Jerelyn Scott  Labs: Lab Results  Component Value Date   WBC 7.3 01/22/2013   HGB 11.4* 01/22/2013   HCT 33.7* 01/22/2013   MCV 87.1 01/22/2013   PLT 211 01/22/2013    Assessment / Plan: Induction of labor due to post dates.  Progression made with Cytotec x1.  Foley Bulb placed without complication.    Labor: Progressing normally Preeclampsia:  None Fetal Wellbeing:  Category I Pain Control:  Labor support without medications I/D:  GBS negative Anticipated MOD:  NSVD  [facility] 01/23/2013, 3:32 AM

## 2013-01-23 NOTE — Progress Notes (Signed)
Stopped by to check on patient. 

## 2013-01-23 NOTE — Progress Notes (Signed)
Judith King is a 22 y.o. G1P0000 at [redacted]w[redacted]d by ultrasound admitted for induction of labor due to Post dates. Due date 01/14/13.  Subjective: Pt states reports feeling contractions with minimal abdominal pain. Denies HA, SOB, CP. Plans on using pain medications during delivery but no epidural.  Husband at bedside. Interpreter present.  Objective: BP 127/74  Pulse 63  Temp(Src) 97.5 F (36.4 C) (Oral)  Resp 20  Ht 5\' 2"  (1.575 m)  Wt 80.74 kg (178 lb)  BMI 32.55 kg/m2  LMP 03/29/2012      FHT:  FHR: 130 bpm, variability: moderate,  accelerations:  Present,  decelerations:  Absent UC:   irregular, every 1-4 minutes SVE:   Dilation: 2 Effacement (%): 30 Station: -3 Exam by:: dr. Jerelyn Scott  Labs: Lab Results  Component Value Date   WBC 7.3 01/22/2013   HGB 11.4* 01/22/2013   HCT 33.7* 01/22/2013   MCV 87.1 01/22/2013   PLT 211 01/22/2013    Assessment / Plan: IOL due to postdates; Foley bulb in place  Labor: Progressing normally Preeclampsia: none Fetal Wellbeing:  Category I Pain Control: Labor support without medications I/D:  GBS negative Anticipated MOD:  NSVD  GARLAND, JEANNA 01/23/2013, 9:54 AM  Evaluation and management procedures were performed by PA-S under my supervision/collaboration. Chart reviewed, patient examined by me and I agree with management and plan. Danae Orleans, CNM 01/23/2013 12:53 PM

## 2013-01-23 NOTE — Progress Notes (Signed)
Judith King is a 22 y.o. G1P0000 at [redacted]w[redacted]d presents for IOL secondary to post-dates.   Subjective: Patient reports pain and pressure.  Husband at bedside.   Objective: BP 139/77   Pulse 66   Temp(Src) 98.7 F (37.1 C) (Oral)   Resp 20   Ht 5\' 2"  (1.575 m)   Wt 80.74 kg (178 lb)   BMI 32.55 kg/m2   LMP 03/29/2012      FHT:  FHR: 150 bpm, variability: moderate,  accelerations:  Present,  decelerations:  Absent UC:   regular, every 2 minutes SVE:   Dilation: 6.5 Effacement (%): 80 Station: -2 Exam by:: dr. Jerelyn Scott  Labs: Lab Results  Component Value Date   WBC 7.3 01/22/2013   HGB 11.4* 01/22/2013   HCT 33.7* 01/22/2013   MCV 87.1 01/22/2013   PLT 211 01/22/2013    Assessment / Plan: Induction of labor due to postterm,  progressing well on pitocin  Labor: Progressing normally Preeclampsia:  Negative Fetal Wellbeing:  Category I Pain Control:  Labor support without medications I/D:  GBS negative Anticipated MOD:  NSVD  Judith King 01/23/2013, 10:48 PM

## 2013-01-24 ENCOUNTER — Encounter (HOSPITAL_COMMUNITY): Payer: Self-pay

## 2013-01-24 ENCOUNTER — Encounter (HOSPITAL_COMMUNITY): Payer: Self-pay | Admitting: Anesthesiology

## 2013-01-24 ENCOUNTER — Encounter (HOSPITAL_COMMUNITY)
Admission: RE | Disposition: A | Discharge status: Home / Self Care | Payer: Self-pay | Source: Ambulatory Visit | Attending: Obstetrics & Gynecology

## 2013-01-24 ENCOUNTER — Inpatient Hospital Stay (HOSPITAL_COMMUNITY): Payer: Medicaid Other | Admitting: Anesthesiology

## 2013-01-24 DIAGNOSIS — O480 Post-term pregnancy: Secondary | ICD-10-CM

## 2013-01-24 DIAGNOSIS — O411090 Infection of amniotic sac and membranes, unspecified, unspecified trimester, not applicable or unspecified: Secondary | ICD-10-CM

## 2013-01-24 SURGERY — Surgical Case
Anesthesia: Regional

## 2013-01-24 MED ORDER — FENTANYL 2.5 MCG/ML BUPIVACAINE 1/10 % EPIDURAL INFUSION (WH - ANES)
14.0000 mL/h | INTRAMUSCULAR | Status: DC | PRN
  Administered 2013-01-24 (×2): 14 mL/h via EPIDURAL
  Filled 2013-01-24 (×2): qty 125

## 2013-01-24 MED ORDER — SCOPOLAMINE 1 MG/3DAYS TD PT72
MEDICATED_PATCH | TRANSDERMAL | Status: AC
  Administered 2013-01-24: 1.5 mg
  Filled 2013-01-24: qty 1

## 2013-01-24 MED ORDER — PROMETHAZINE HCL 25 MG RE SUPP: 25.0000 mg | Freq: Once | RECTAL | Status: DC | PRN

## 2013-01-24 MED ORDER — SIMETHICONE 80 MG PO CHEW: 80.0000 mg | CHEWABLE_TABLET | ORAL | Status: DC | PRN

## 2013-01-24 MED ORDER — SIMETHICONE 80 MG PO CHEW
80.0000 mg | CHEWABLE_TABLET | Freq: Three times a day (TID) | ORAL | Status: DC
  Administered 2013-01-24 – 2013-01-27 (×9): 80 mg via ORAL

## 2013-01-24 MED ORDER — LIDOCAINE-EPINEPHRINE (PF) 2 %-1:200000 IJ SOLN
INTRAMUSCULAR | Status: AC
  Filled 2013-01-24: qty 20

## 2013-01-24 MED ORDER — ONDANSETRON HCL 4 MG/2ML IJ SOLN: 4.0000 mg | Freq: Three times a day (TID) | INTRAMUSCULAR | Status: DC | PRN

## 2013-01-24 MED ORDER — METOCLOPRAMIDE HCL 5 MG/ML IJ SOLN: 10.0000 mg | Freq: Three times a day (TID) | INTRAMUSCULAR | Status: DC | PRN

## 2013-01-24 MED ORDER — SENNOSIDES-DOCUSATE SODIUM 8.6-50 MG PO TABS
2.0000 | ORAL_TABLET | Freq: Every day | ORAL | Status: DC
  Administered 2013-01-24 – 2013-01-26 (×3): 2 via ORAL

## 2013-01-24 MED ORDER — WITCH HAZEL-GLYCERIN EX PADS: 1.0000 "application " | MEDICATED_PAD | CUTANEOUS | Status: DC | PRN

## 2013-01-24 MED ORDER — IBUPROFEN 600 MG PO TABS
600.0000 mg | ORAL_TABLET | Freq: Four times a day (QID) | ORAL | Status: DC
  Administered 2013-01-25 – 2013-01-27 (×12): 600 mg via ORAL
  Filled 2013-01-24 (×13): qty 1

## 2013-01-24 MED ORDER — OXYCODONE-ACETAMINOPHEN 5-325 MG PO TABS
1.0000 | ORAL_TABLET | ORAL | Status: DC | PRN
  Administered 2013-01-25: 2 via ORAL
  Administered 2013-01-26: 1 via ORAL
  Administered 2013-01-26: 2 via ORAL
  Administered 2013-01-27: 1 via ORAL
  Filled 2013-01-24: qty 1
  Filled 2013-01-24: qty 2
  Filled 2013-01-24: qty 1
  Filled 2013-01-24: qty 2

## 2013-01-24 MED ORDER — DIPHENHYDRAMINE HCL 25 MG PO CAPS: 25.0000 mg | ORAL_CAPSULE | Freq: Four times a day (QID) | ORAL | Status: DC | PRN

## 2013-01-24 MED ORDER — MORPHINE SULFATE (PF) 0.5 MG/ML IJ SOLN
INTRAMUSCULAR | Status: DC | PRN
  Administered 2013-01-24: 3 mg via EPIDURAL

## 2013-01-24 MED ORDER — FENTANYL CITRATE 0.05 MG/ML IJ SOLN: 100.0000 ug | INTRAMUSCULAR | Status: DC | PRN

## 2013-01-24 MED ORDER — TETANUS-DIPHTH-ACELL PERTUSSIS 5-2.5-18.5 LF-MCG/0.5 IM SUSP
0.5000 mL | Freq: Once | INTRAMUSCULAR | Status: AC
  Administered 2013-01-25: 0.5 mL via INTRAMUSCULAR

## 2013-01-24 MED ORDER — SODIUM CHLORIDE 0.9 % IJ SOLN: 3.0000 mL | INTRAMUSCULAR | Status: DC | PRN

## 2013-01-24 MED ORDER — PHENYLEPHRINE 40 MCG/ML (10ML) SYRINGE FOR IV PUSH (FOR BLOOD PRESSURE SUPPORT)
80.0000 ug | PREFILLED_SYRINGE | INTRAVENOUS | Status: DC | PRN
  Filled 2013-01-24: qty 5

## 2013-01-24 MED ORDER — DIPHENHYDRAMINE HCL 50 MG/ML IJ SOLN: 12.5000 mg | INTRAMUSCULAR | Status: DC | PRN

## 2013-01-24 MED ORDER — MORPHINE SULFATE 0.5 MG/ML IJ SOLN
INTRAMUSCULAR | Status: AC
  Filled 2013-01-24: qty 10

## 2013-01-24 MED ORDER — CEFAZOLIN SODIUM-DEXTROSE 2-3 GM-% IV SOLR: 2.0000 g | INTRAVENOUS | Status: DC

## 2013-01-24 MED ORDER — KETOROLAC TROMETHAMINE 30 MG/ML IJ SOLN: 15.0000 mg | Freq: Once | INTRAMUSCULAR | Status: DC | PRN

## 2013-01-24 MED ORDER — KETOROLAC TROMETHAMINE 60 MG/2ML IM SOLN: 60.0000 mg | Freq: Once | INTRAMUSCULAR | Status: AC | PRN

## 2013-01-24 MED ORDER — EPHEDRINE 5 MG/ML INJ
10.0000 mg | INTRAVENOUS | Status: DC | PRN
  Filled 2013-01-24: qty 4

## 2013-01-24 MED ORDER — OXYTOCIN 10 UNIT/ML IJ SOLN
40.0000 [IU] | INTRAVENOUS | Status: DC | PRN
  Administered 2013-01-24: 40 [IU] via INTRAVENOUS

## 2013-01-24 MED ORDER — OXYTOCIN 40 UNITS IN LACTATED RINGERS INFUSION - SIMPLE MED: 62.5000 mL/h | INTRAVENOUS | Status: AC

## 2013-01-24 MED ORDER — DEXTROSE 5 % IV SOLN
160.0000 mg | Freq: Once | INTRAVENOUS | Status: AC
  Administered 2013-01-24: 160 mg via INTRAVENOUS
  Filled 2013-01-24: qty 4

## 2013-01-24 MED ORDER — DIBUCAINE 1 % RE OINT: 1.0000 "application " | TOPICAL_OINTMENT | RECTAL | Status: DC | PRN

## 2013-01-24 MED ORDER — SODIUM BICARBONATE 8.4 % IV SOLN
INTRAVENOUS | Status: AC
  Filled 2013-01-24: qty 50

## 2013-01-24 MED ORDER — NALOXONE HCL 1 MG/ML IJ SOLN: 1.0000 ug/kg/h | INTRAVENOUS | Status: DC | PRN

## 2013-01-24 MED ORDER — PHENYLEPHRINE 40 MCG/ML (10ML) SYRINGE FOR IV PUSH (FOR BLOOD PRESSURE SUPPORT): 80.0000 ug | PREFILLED_SYRINGE | INTRAVENOUS | Status: DC | PRN

## 2013-01-24 MED ORDER — KETOROLAC TROMETHAMINE 60 MG/2ML IM SOLN
INTRAMUSCULAR | Status: AC
  Administered 2013-01-24: 60 mg via INTRAMUSCULAR
  Filled 2013-01-24: qty 2

## 2013-01-24 MED ORDER — ONDANSETRON HCL 4 MG/2ML IJ SOLN
INTRAMUSCULAR | Status: DC | PRN
  Administered 2013-01-24: 4 mg via INTRAVENOUS

## 2013-01-24 MED ORDER — NALOXONE HCL 0.4 MG/ML IJ SOLN: 0.4000 mg | INTRAMUSCULAR | Status: DC | PRN

## 2013-01-24 MED ORDER — LIDOCAINE HCL (PF) 1 % IJ SOLN
INTRAMUSCULAR | Status: DC | PRN
  Administered 2013-01-24 (×4): 4 mL

## 2013-01-24 MED ORDER — OXYTOCIN 10 UNIT/ML IJ SOLN
INTRAMUSCULAR | Status: AC
  Filled 2013-01-24: qty 4

## 2013-01-24 MED ORDER — CEFAZOLIN SODIUM-DEXTROSE 2-3 GM-% IV SOLR
INTRAVENOUS | Status: AC
  Filled 2013-01-24: qty 50

## 2013-01-24 MED ORDER — NALBUPHINE HCL 10 MG/ML IJ SOLN
5.0000 mg | INTRAMUSCULAR | Status: DC | PRN
Start: 2013-01-24 — End: 2013-01-27

## 2013-01-24 MED ORDER — MEPERIDINE HCL 25 MG/ML IJ SOLN
INTRAMUSCULAR | Status: AC
  Filled 2013-01-24: qty 1

## 2013-01-24 MED ORDER — ONDANSETRON HCL 4 MG/2ML IJ SOLN
INTRAMUSCULAR | Status: AC
  Filled 2013-01-24: qty 2

## 2013-01-24 MED ORDER — GENTAMICIN SULFATE 40 MG/ML IJ SOLN
150.0000 mg | Freq: Three times a day (TID) | INTRAVENOUS | Status: DC
  Filled 2013-01-24 (×2): qty 3.75

## 2013-01-24 MED ORDER — DIPHENHYDRAMINE HCL 50 MG/ML IJ SOLN: 25.0000 mg | INTRAMUSCULAR | Status: DC | PRN

## 2013-01-24 MED ORDER — KETOROLAC TROMETHAMINE 30 MG/ML IJ SOLN: 30.0000 mg | Freq: Four times a day (QID) | INTRAMUSCULAR | Status: AC | PRN

## 2013-01-24 MED ORDER — PRENATAL MULTIVITAMIN CH
1.0000 | ORAL_TABLET | Freq: Every day | ORAL | Status: DC
  Administered 2013-01-25 – 2013-01-27 (×3): 1 via ORAL
  Filled 2013-01-24 (×3): qty 1

## 2013-01-24 MED ORDER — EPHEDRINE 5 MG/ML INJ: 10.0000 mg | INTRAVENOUS | Status: DC | PRN

## 2013-01-24 MED ORDER — AMPICILLIN SODIUM 2 G IJ SOLR
2.0000 g | Freq: Four times a day (QID) | INTRAMUSCULAR | Status: DC
  Administered 2013-01-24 (×2): 2 g via INTRAVENOUS
  Filled 2013-01-24 (×4): qty 2000

## 2013-01-24 MED ORDER — LACTATED RINGERS IV SOLN: 500.0000 mL | Freq: Once | INTRAVENOUS | Status: DC

## 2013-01-24 MED ORDER — SODIUM BICARBONATE 8.4 % IV SOLN
INTRAVENOUS | Status: AC
Start: 2013-01-24 — End: 2013-01-24
  Filled 2013-01-24: qty 50

## 2013-01-24 MED ORDER — MEPERIDINE HCL 25 MG/ML IJ SOLN: 6.2500 mg | INTRAMUSCULAR | Status: DC | PRN

## 2013-01-24 MED ORDER — HYDROMORPHONE HCL PF 1 MG/ML IJ SOLN: 0.2500 mg | INTRAMUSCULAR | Status: DC | PRN

## 2013-01-24 MED ORDER — SCOPOLAMINE 1 MG/3DAYS TD PT72: 1.0000 | MEDICATED_PATCH | Freq: Once | TRANSDERMAL | Status: DC

## 2013-01-24 MED ORDER — NALBUPHINE HCL 10 MG/ML IJ SOLN: 5.0000 mg | INTRAMUSCULAR | Status: DC | PRN

## 2013-01-24 MED ORDER — LACTATED RINGERS IV SOLN
INTRAVENOUS | Status: DC
  Administered 2013-01-24 – 2013-01-25 (×4): via INTRAVENOUS

## 2013-01-24 MED ORDER — MENTHOL 3 MG MT LOZG: 1.0000 | LOZENGE | OROMUCOSAL | Status: DC | PRN

## 2013-01-24 MED ORDER — ONDANSETRON HCL 4 MG/2ML IJ SOLN: 4.0000 mg | INTRAMUSCULAR | Status: DC | PRN

## 2013-01-24 MED ORDER — KETOROLAC TROMETHAMINE 30 MG/ML IJ SOLN
INTRAMUSCULAR | Status: AC
  Filled 2013-01-24: qty 1

## 2013-01-24 MED ORDER — DIPHENHYDRAMINE HCL 25 MG PO CAPS
25.0000 mg | ORAL_CAPSULE | ORAL | Status: DC | PRN
  Filled 2013-01-24: qty 1

## 2013-01-24 MED ORDER — LANOLIN HYDROUS EX OINT: 1.0000 "application " | TOPICAL_OINTMENT | CUTANEOUS | Status: DC | PRN

## 2013-01-24 MED ORDER — MEPERIDINE HCL 25 MG/ML IJ SOLN
INTRAMUSCULAR | Status: DC | PRN
  Administered 2013-01-24 (×2): 12.5 mg via INTRAVENOUS

## 2013-01-24 MED ORDER — SODIUM BICARBONATE 8.4 % IV SOLN
INTRAVENOUS | Status: DC | PRN
  Administered 2013-01-24: 3 mL via EPIDURAL

## 2013-01-24 MED ORDER — ONDANSETRON HCL 4 MG PO TABS: 4.0000 mg | ORAL_TABLET | ORAL | Status: DC | PRN

## 2013-01-24 SURGICAL SUPPLY — 26 items
CLAMP CORD UMBIL (MISCELLANEOUS) IMPLANT
CONTAINER PREFILL 10% NBF 15ML (MISCELLANEOUS) IMPLANT
DRAPE LG THREE QUARTER DISP (DRAPES) ×2 IMPLANT
DRSG OPSITE POSTOP 4X10 (GAUZE/BANDAGES/DRESSINGS) ×2 IMPLANT
DURAPREP 26ML APPLICATOR (WOUND CARE) ×2 IMPLANT
ELECT REM PT RETURN 9FT ADLT (ELECTROSURGICAL) ×2
ELECTRODE REM PT RTRN 9FT ADLT (ELECTROSURGICAL) ×1 IMPLANT
EXTRACTOR VACUUM M CUP 4 TUBE (SUCTIONS) IMPLANT
GLOVE BIOGEL PI IND STRL 6.5 (GLOVE) ×1 IMPLANT
GLOVE BIOGEL PI INDICATOR 6.5 (GLOVE) ×1
GLOVE SURG SS PI 6.0 STRL IVOR (GLOVE) ×2 IMPLANT
GOWN STRL REIN XL XLG (GOWN DISPOSABLE) ×4 IMPLANT
KIT ABG SYR 3ML LUER SLIP (SYRINGE) IMPLANT
NEEDLE HYPO 25X5/8 SAFETYGLIDE (NEEDLE) IMPLANT
NS IRRIG 1000ML POUR BTL (IV SOLUTION) ×2 IMPLANT
PACK C SECTION WH (CUSTOM PROCEDURE TRAY) ×2 IMPLANT
PAD OB MATERNITY 4.3X12.25 (PERSONAL CARE ITEMS) ×2 IMPLANT
RTRCTR C-SECT PINK 25CM LRG (MISCELLANEOUS) ×2 IMPLANT
SEPRAFILM MEMBRANE 5X6 (MISCELLANEOUS) IMPLANT
STAPLER VISISTAT 35W (STAPLE) ×2 IMPLANT
SUT PLAIN 0 NONE (SUTURE) IMPLANT
SUT VIC AB 0 CT1 36 (SUTURE) ×8 IMPLANT
SUT VIC AB 4-0 KS 27 (SUTURE) IMPLANT
TOWEL OR 17X24 6PK STRL BLUE (TOWEL DISPOSABLE) ×6 IMPLANT
TRAY FOLEY CATH 14FR (SET/KITS/TRAYS/PACK) ×2 IMPLANT
WATER STERILE IRR 1000ML POUR (IV SOLUTION) ×2 IMPLANT

## 2013-01-24 NOTE — Anesthesia Postprocedure Evaluation (Signed)
Anesthesia Post Note  Patient: Judith King  Procedure(s) Performed: Procedure(s) (LRB): CESAREAN SECTION (N/A)  Anesthesia type: Epidural  Patient location: PACU  Post pain: Pain level controlled  Post assessment: Post-op Vital signs reviewed  Last Vitals:  Filed Vitals:   01/24/13 1344  BP:   Pulse:   Temp:   Resp: 20    Post vital signs: Reviewed  Level of consciousness: awake  Complications: No apparent anesthesia complications

## 2013-01-24 NOTE — Progress Notes (Signed)
Judith King is a 22 y.o. G1P0000 at [redacted]w[redacted]d   Subjective: Comfortable with epidural; on Amp and Gent for Tmax 102 @0730   Objective: BP 129/69  Pulse 87  Temp(Src) 99.5 F (37.5 C) (Oral)  Resp 20  Ht 5\' 2"  (1.575 m)  Wt 80.74 kg (178 lb)  BMI 32.55 kg/m2  SpO2 96%  LMP 03/29/2012   Total I/O In: -  Out: 150 [Urine:150]  FHT:  FHR: 160 bpm, variability: moderate,  accelerations:  Present,  decelerations:  Absent; occ mi variables UC:   regular, every 2-3 minutes with Pitocin at 84mu/min SVE:   Dilation: 9 Effacement (%): 80 Station: 0 Exam by:: Judith King, CNM- IUPC inserted; forebag ruptured  Labs: Lab Results  Component Value Date   WBC 7.3 01/22/2013   HGB 11.4* 01/22/2013   HCT 33.7* 01/22/2013   MCV 87.1 01/22/2013   PLT 211 01/22/2013    Assessment / Plan: IUP at term Protracted active phase Fever  Will eval MVUs to determine adequate labor/contractions Continue Amp and Judith King, Judith King 01/24/2013, 10:27 AM

## 2013-01-24 NOTE — Anesthesia Preprocedure Evaluation (Signed)

## 2013-01-24 NOTE — Progress Notes (Signed)
ANTIBIOTIC CONSULT NOTE - INITIAL  Pharmacy Consult for Gentamicin Indication:  Maternal temperature  No Known Allergies  Patient Measurements: Height: 5\' 2"  (157.5 cm) Weight: 178 lb (80.74 kg) IBW/kg (Calculated) : 50.1 Adjusted Body Weight: 59.3kg  Vital Signs: Temp: 101 F (38.3 C) (06/18 0610) Temp src: Oral (06/18 0610) BP: 149/87 mmHg (06/18 0630) Pulse Rate: 86 (06/18 0630)  Labs:  Recent Labs  01/22/13 2005  WBC 7.3  HGB 11.4*  PLT 211   No results found for this basename: GENTTROUGH, GENTPEAK, GENTRANDOM,  in the last 72 hours   Microbiology: No results found for this or any previous visit (from the past 720 hour(s)).  Medications:  Ampicillin 2 grams IV every 6 hours.  Assessment: 22 y.o. female G1P0000 at [redacted]w[redacted]d with maternal temperature Estimated Ke = 0.299, Vd = 0.4L/kg  Goal of Therapy:  Gentamicin peak 6-8 mg/L and Trough < 1 mg/L  Plan:  Gentamicin 160 mg IV x 1  Gentamicin 150 mg IV every 8 hrs  Check Scr with next labs if gentamicin continued. Will check gentamicin levels if continued > 72hr or clinically indicated.  Berlin Hun D 01/24/2013,7:28 AM

## 2013-01-24 NOTE — Progress Notes (Signed)
Judith King is a 22 y.o. G1P1001 at [redacted]w[redacted]d   Subjective: Comfortable with epidural  Objective: BP 115/51  Pulse 118  Temp(Src) 98.4 F (36.9 C) (Axillary)  Resp 23  Ht 5\' 2"  (1.575 m)  Wt 80.74 kg (178 lb)  BMI 32.55 kg/m2  SpO2 100%  LMP 03/29/2012   Total I/O In: 1100 [I.V.:1100] Out: 1250 [Urine:550; Blood:700]  FHT:  FHR: 155-160 bpm, variability: moderate,  accelerations:  Present,  decelerations:  Absent- occ mi variables UC:   Regular with adequate MVUs above x 2 hours SVE:   Dilation: 9 Effacement (%): 80 Station: 0 Exam by:: Pincus Badder, CNM- remains unchanged  Labs: Lab Results  Component Value Date   WBC 7.3 01/22/2013   HGB 11.4* 01/22/2013   HCT 33.7* 01/22/2013   MCV 87.1 01/22/2013   PLT 211 01/22/2013    Assessment / Plan: Arrest of active phase Chorio  Called Dr Constant to eval and review options with pt   Cam Hai 01/24/2013, 3:56 PM

## 2013-01-24 NOTE — Transfer of Care (Signed)
Immediate Anesthesia Transfer of Care Note  Patient: Judith King  Procedure(s) Performed: Procedure(s): CESAREAN SECTION (N/A)  Patient Location: PACU  Anesthesia Type:Epidural  Level of Consciousness: awake  Airway & Oxygen Therapy: Patient Spontanous Breathing  Post-op Assessment: Report given to PACU RN  Post vital signs: Reviewed and stable  Complications: No apparent anesthesia complications

## 2013-01-24 NOTE — Op Note (Signed)
Judith King PROCEDURE DATE: 01/22/2013 - 01/24/2013  PREOPERATIVE DIAGNOSIS: Intrauterine pregnancy at  [redacted]w[redacted]d weeks gestation; failure to progress: arrest of dilation  POSTOPERATIVE DIAGNOSIS: The same  PROCEDURE:     Cesarean Section  SURGEON:  Dr. Catalina Antigua  ASSISTANT: noe  INDICATIONS: Judith King is a 22 y.o. G1P1001 at [redacted]w[redacted]d scheduled for cesarean section secondary to failure to progress: arrest of dilation. Patient reached 9 cm and remained at that dilation despite 2 hours of adequate contraction. Patient also developed chorioamnionitis. The risks of cesarean section discussed with the patient included but were not limited to: bleeding which may require transfusion or reoperation; infection which may require antibiotics; injury to bowel, bladder, ureters or other surrounding organs; injury to the fetus; need for additional procedures including hysterectomy in the event of a life-threatening hemorrhage; placental abnormalities wth subsequent pregnancies, incisional problems, thromboembolic phenomenon and other postoperative/anesthesia complications. The patient concurred with the proposed plan, giving informed written consent for the procedure.    FINDINGS:  Viable female infant in cephalic presentation.  Apgars 8 and 9.  Clear amniotic fluid with particulate matter.  Intact placenta, three vessel cord.  Normal uterus, fallopian tubes and ovaries bilaterally.  ANESTHESIA:    Epidural INTRAVENOUS FLUIDS:1300 ml ESTIMATED BLOOD LOSS: 900 ml URINE OUTPUT:  100 ml SPECIMENS: Placenta sent to pathology/L&D COMPLICATIONS: None immediate  PROCEDURE IN DETAIL:  The patient received intravenous antibiotics and had sequential compression devices applied to her lower extremities while in the preoperative area.  She was then taken to the operating room where anesthesia was induced and was found to be adequate. A foley catheter was placed into her bladder and attached  to Niomi Valent gravity. She was then placed in a dorsal supine position with a leftward tilt, and prepped and draped in a sterile manner. After an adequate timeout was performed, a Pfannenstiel skin incision was made with scalpel and carried through to the underlying layer of fascia. The fascia was incised in the midline and this incision was extended bilaterally using the Mayo scissors. Kocher clamps were applied to the superior aspect of the fascial incision and the underlying rectus muscles were dissected off bluntly. A similar process was carried out on the inferior aspect of the facial incision. The rectus muscles were separated in the midline bluntly and the peritoneum was entered bluntly. The Alexis self-retaining retractor was introduced into the abdominal cavity. Attention was turned to the lower uterine segment where a bladder flap was created, and a transverse hysterotomy was made with a scalpel and extended bilaterally bluntly. The infant was successfully delivered, and cord was clamped and cut and infant was handed over to awaiting neonatology team. Uterine massage was then administered and the placenta delivered intact with three-vessel cord. The uterus was cleared of clot and debris.  The hysterotomy was closed with 0 Vicryl in a running locked fashion, and an imbricating layer was also placed with a 0 Vicryl. Overall, excellent hemostasis was noted. The pelvis copiously irrigated and cleared of all clot and debris. Hemostasis was confirmed on all surfaces.  The peritoneum and the muscles were reapproximated using 0 vicryl interrupted stitches. The fascia was then closed using 0 Vicryl in a running locked fashion.  The subcutaneous layer was reapproximated with plain gut and the skin was closed in a subcuticular fashion using 3.0 Vicryl. The patient tolerated the procedure well. Sponge, lap, instrument and needle counts were correct x 2. She was taken to the recovery room in stable condition.  Judith King,PEGGYMD  01/24/2013 2:47 PM

## 2013-01-24 NOTE — Progress Notes (Signed)
Judith King is a 22 y.o. G1P0000 at [redacted]w[redacted]d who presents for IOL secondary to post-dates.   Subjective: Patient's pain well controlled.  Notified by nursing that patient febrile.  Amp/Gent started.   Objective: BP 147/80   Pulse 83   Temp(Src) 102 F (38.9 C) (Oral)   Resp 20   Ht 5\' 2"  (1.575 m)   Wt 80.74 kg (178 lb)   BMI 32.55 kg/m2   SpO2 96%   LMP 03/29/2012   Total I/O In: -  Out: 150 [Urine:150]  FHT:  FHR: 165 bpm, variability: moderate,  accelerations:  Present,  decelerations:  Absent UC:   regular, every 2 minutes SVE:   Dilation: Lip/rim Effacement (%): 100 Station: 0 Exam by:: Dr. Jerelyn Scott  Labs: Lab Results  Component Value Date   WBC 7.3 01/22/2013   HGB 11.4* 01/22/2013   HCT 33.7* 01/22/2013   MCV 87.1 01/22/2013   PLT 211 01/22/2013    Assessment / Plan: Induction of labor due to postterm,  progressing well on pitocin  Labor: Progressing normally Preeclampsia:  None Fetal Wellbeing:  Category I Pain Control:  Epidural I/D:  Patient with maternal fever this AM.  Amp/Gent started.  GBS negative. Anticipated MOD:  NSVD  [facility] 01/24/2013, 7:48 AM

## 2013-01-24 NOTE — Progress Notes (Signed)
Patient without cervical change since 6:30 am despite 2 hours of adequate contractions. Patient with chorioamnionitis currently receiving gent and clinda. Discussed delivery via primary cesarean section. Risk, benefits and alternatives were explained including but not limited to risks of bleeding, infection and damage to adjacent organs. All questions were answered. Patient verbalized understanding and consent was signed. Patient was counseled in the presence of Spanish interpreter, [employer]

## 2013-01-24 NOTE — Progress Notes (Signed)
Dr. Jolayne Panther explained to pt and family the need for a c-section delivery via Eda, spanish interpreter.  Pt and family verbalized understandings.  Will continue to monitor.

## 2013-01-24 NOTE — Progress Notes (Signed)
I assisted Dr Ladean Raya with consent for C-Section.

## 2013-01-24 NOTE — Progress Notes (Signed)
Stopped by to check on patient. 

## 2013-01-24 NOTE — Anesthesia Procedure Notes (Signed)
Epidural Patient location during procedure: OB Start time: 01/24/2013 1:00 AM  Staffing Performed by: anesthesiologist   Preanesthetic Checklist Completed: patient identified, site marked, surgical consent, pre-op evaluation, timeout performed, IV checked, risks and benefits discussed and monitors and equipment checked  Epidural Patient position: sitting Prep: site prepped and draped and DuraPrep Patient monitoring: continuous pulse ox and blood pressure Approach: midline Injection technique: LOR air  Needle:  Needle type: Tuohy  Needle gauge: 17 G Needle length: 9 cm and 9 Needle insertion depth: 4 cm Catheter type: closed end flexible Catheter size: 19 Gauge Catheter at skin depth: 9 cm Test dose: negative  Assessment Events: blood not aspirated, injection not painful, no injection resistance, negative IV test and no paresthesia  Additional Notes Discussed risk of headache, infection, bleeding, nerve injury and failed or incomplete block.  Patient voices understanding and wishes to proceed.  Spanish translator present.  Epidural placed easily on first attempt.  No paresthesia.  Patient tolerated procedure well with no apparent complications.  Jasmine December, MD Reason for block:procedure for pain

## 2013-01-24 NOTE — Progress Notes (Signed)
Judith King is a 22 y.o. G1P0000 at [redacted]w[redacted]d presents for induction of labor secondary to post-dates.  Subjective: Patient comfortable with good pain control.    Objective: BP 131/62   Pulse 80   Temp(Src) 98.6 F (37 C) (Oral)   Resp 18   Ht 5\' 2"  (1.575 m)   Wt 80.74 kg (178 lb)   BMI 32.55 kg/m2   SpO2 96%   LMP 03/29/2012      FHT:  FHR: 150 bpm, variability: moderate,  accelerations:  Present,  decelerations:  Absent UC:   regular, every 2-3 minutes SVE:   Dilation: 9 Effacement (%): 90 Station: 0 Exam by:: dr. Jerelyn Scott  Labs: Lab Results  Component Value Date   WBC 7.3 01/22/2013   HGB 11.4* 01/22/2013   HCT 33.7* 01/22/2013   MCV 87.1 01/22/2013   PLT 211 01/22/2013    Assessment / Plan: Induction of labor due to postterm,  progressing well on pitocin  Labor: Progressing normally Preeclampsia:  Nonr Fetal Wellbeing:  Category I Pain Control:  Epidural I/D:  GBS negative Anticipated MOD:  NSVD  Holly Stegall 01/24/2013, 5:03 AM

## 2013-01-25 ENCOUNTER — Encounter (HOSPITAL_COMMUNITY): Payer: Self-pay | Admitting: Obstetrics and Gynecology

## 2013-01-25 LAB — CBC
MCH: 28.8 pg (ref 26.0–34.0)
MCV: 86.5 fL (ref 78.0–100.0)
Platelets: 153 10*3/uL (ref 150–400)
RDW: 15.8 % — ABNORMAL HIGH (ref 11.5–15.5)
WBC: 15.7 10*3/uL — ABNORMAL HIGH (ref 4.0–10.5)

## 2013-01-25 NOTE — Progress Notes (Signed)
UR chart review completed.  

## 2013-01-25 NOTE — Anesthesia Postprocedure Evaluation (Signed)
Anesthesia Post Note  Patient: Judith King  Procedure(s) Performed: Procedure(s) (LRB): CESAREAN SECTION (N/A)  Anesthesia type: Epidural  Patient location: Mother/Baby  Post pain: Pain level controlled  Post assessment: Post-op Vital signs reviewed  Last Vitals:  Filed Vitals:   01/25/13 0602  BP: 104/50  Pulse: 84  Temp: 36.6 C  Resp: 18    Post vital signs: Reviewed  Level of consciousness: awake  Complications: No apparent anesthesia complications

## 2013-01-25 NOTE — Progress Notes (Signed)
Subjective: Postpartum Day 1: Cesarean Delivery Patient reports tolerating PO. Foley in place. Decreased output. No flatus.    Objective: Vital signs in last 24 hours: Temp:  [97.8 F (36.6 C)-101 F (38.3 C)] 97.8 F (36.6 C) (06/19 0602) Pulse Rate:  [75-122] 84 (06/19 0602) Resp:  [15-25] 18 (06/19 0602) BP: (102-143)/(47-72) 104/50 mmHg (06/19 0602) SpO2:  [94 %-100 %] 96 % (06/19 0602)  Physical Exam:  General: alert, cooperative, appears stated age and no distress Lochia: appropriate Uterine Fundus: firm Incision: healing well, DRB marked on dressing.  DVT Evaluation: No evidence of DVT seen on physical exam.   Recent Labs  01/22/13 2005 01/25/13 0630  HGB 11.4* 8.3*  HCT 33.7* 24.9*   Intake/Output     06/18 0701 - 06/19 0700 06/19 0701 - 06/20 0700   I.V. (mL/kg) 2025 (25.1)    Total Intake(mL/kg) 2025 (25.1)    Urine (mL/kg/hr) 950 (0.5)    Blood 700 (0.4)    Total Output 1650     Net +375            Assessment/Plan: Status post Cesarean section. Postoperative course complicated by decreased utrine output, increase incisional bleeding, appears to be old blood.     Plan: 500 ml fluid bolus. Reassess bleeding on dressing later today.    Dorathy Kinsman 01/25/2013, 7:39 AM

## 2013-01-26 NOTE — Progress Notes (Signed)
Subjective: Postpartum Day 2: Cesarean Delivery Patient reports tolerating PO, + flatus, + BM and no problems voiding.  Denies incision/abdominal pain. Urine output originally decreased post-op; now steadily increasing. 100 mL/hr yesterday from 1500-1900; last documented.  Objective: Vital signs in last 24 hours: Temp:  [98 F (36.7 C)-98.6 F (37 C)] 98 F (36.7 C) (06/20 0520) Pulse Rate:  [76-84] 76 (06/20 0520) Resp:  [18-22] 19 (06/20 0520) BP: (94-111)/(45-61) 111/61 mmHg (06/20 0520) SpO2:  [95 %-96 %] 96 % (06/20 0520)  Physical Exam:  General: alert, cooperative and no distress Lochia: appropriate Uterine Fundus: firm Incision: healing well, no dehiscence, no significant erythema; dressing saturated with sanguinous fluid DVT Evaluation: No evidence of DVT seen on physical exam. Negative Homan's sign. No cords or calf tenderness. Edema to lower extremities.   Recent Labs  01/25/13 0630  HGB 8.3*  HCT 24.9*    Assessment/Plan: Status post Cesarean section. Postoperative course complicated by originally decreased urine output. Urine output steadily increasing.   Continue current care. Dressing change. Breastfeeding without difficulty. Planning Nexplanon;  f/u at [facility].  Ardis Hughs 01/26/2013, 7:49 AM Evaluation and management procedures were performed by PA-S under my supervision/collaboration. Chart reviewed, patient examined by me and I agree with management and plan. Danae Orleans, CNM 01/26/2013 9:56 AM

## 2013-01-27 MED ORDER — OXYCODONE-ACETAMINOPHEN 5-325 MG PO TABS: 1.0000 | ORAL_TABLET | ORAL | Status: DC | PRN

## 2013-01-27 MED ORDER — FERROUS SULFATE 325 (65 FE) MG PO TABS: 325.0000 mg | ORAL_TABLET | Freq: Two times a day (BID) | ORAL | Status: DC

## 2013-01-27 MED ORDER — IBUPROFEN 600 MG PO TABS: 600.0000 mg | ORAL_TABLET | Freq: Four times a day (QID) | ORAL | Status: DC | PRN

## 2013-01-27 NOTE — Discharge Summary (Signed)
Obstetric Discharge Summary Reason for Admission: induction of labor d/t postdates Prenatal Procedures: ultrasound Intrapartum Procedures: cesarean: low cervical, transverse and IV Gent and Clinda for chorioamnionitis Postpartum Procedures: none Complications-Operative and Postpartum: none Eating, drinking, voiding, ambulating well.  +flatus.  Lochia and pain wnl.  Denies dizziness, lightheadedness, or sob. No complaints.    Hospital Course: Judith King is a 22 y.o. G39P1001 female who presented @ [redacted]w[redacted]d for IOL d/t postdates.  She developed chorioamnionitis and was treated w/ gentamycin and clindamycin.  She progressed to 9/80/0 on Day 3 and made no further change despite adequate uc's, so the decision for PLTCS d/t FTP was made. Her postpartum course has been uneventful.   Hemoglobin  Date Value Range Status  01/25/2013 8.3* 12.0 - 15.0 g/dL Final     HCT  Date Value Range Status  01/25/2013 24.9* 36.0 - 46.0 % Final    Physical Exam:  General: alert, cooperative and no distress Lochia: appropriate Uterine Fundus: firm Incision: healing well, no significant drainage, no dehiscence, no significant erythema, honeycomb dressing intact w/o drainage DVT Evaluation: No evidence of DVT seen on physical exam. Negative Homan's sign. No cords or calf tenderness. No significant calf/ankle edema.  Discharge Diagnoses: Term Pregnancy-delivered, PLTCS for FTP  Discharge Information: Date: 01/27/2013 Activity: pelvic rest Diet: routine Medications: PNV, Ibuprofen and Percocet, Iron Condition: stable Instructions: refer to practice specific booklet Discharge to: home Follow-up Information   Follow up with [facility]. (as scheduled in 6 weeks for your postpartum visit)    Contact information:   [address] [place] [address] [phone]      Newborn Data: Live born female  Birth Weight: 9 lb 9.4 oz (4350 g) APGAR: 8, 9  Home with  mother. Breastfeeding Nexplanon for contraception  Marge Duncans 01/27/2013, 9:44 AM

## 2013-02-27 ENCOUNTER — Ambulatory Visit: Payer: Self-pay | Admitting: Advanced Practice Midwife

## 2013-02-27 ENCOUNTER — Encounter: Payer: Self-pay | Admitting: *Deleted

## 2013-02-28 ENCOUNTER — Telehealth: Payer: Self-pay | Admitting: *Deleted

## 2013-02-28 NOTE — Telephone Encounter (Signed)
Judith King from the [facility] Department states pt scored a 14 on [place] depression scale. Pt missed her appt yesterday and letter was sent for pt to reschedule. Unable to reach pt by phone today, will continue to try to contact pt for an appt.

## 2013-02-28 NOTE — Telephone Encounter (Signed)
Cyril Mourning, NP informed Clydie Braun from [facility] called and stated pt scored 14 on [place] scale and several attempts made to contact pt with no success. Letter sent to remind pt to r/s appt.

## 2013-03-07 ENCOUNTER — Encounter: Payer: Self-pay | Admitting: Women's Health

## 2013-03-07 ENCOUNTER — Ambulatory Visit (INDEPENDENT_AMBULATORY_CARE_PROVIDER_SITE_OTHER): Payer: Self-pay | Admitting: Women's Health

## 2013-03-07 VITALS — BP 110/64 | Wt 140.0 lb

## 2013-03-07 DIAGNOSIS — Z3400 Encounter for supervision of normal first pregnancy, unspecified trimester: Secondary | ICD-10-CM

## 2013-03-07 NOTE — Patient Instructions (Signed)
Etonogestrel implant Qu es este medicamento? El ETONOGESTREL es un dispositivo anticonceptivo (control de la natalidad). Se utiliza para prevenir el embarazo. Se puede utilizar hasta 3 aos. Este medicamento puede ser utilizado para otros usos; si tiene alguna pregunta consulte con su proveedor de atencin mdica o con su farmacutico. Qu le debo informar a mi profesional de la salud antes de tomar este medicamento? Necesita saber si usted presenta alguno de los siguientes problemas o situaciones: -sangrado vaginal anormal -enfermedad vascular o cogulos sanguneos -cncer de mama, cervical, heptico -depresin -diabetes -enfermedad de la vescula biliar -dolores de cabeza -enfermedad cardiaca o ataque cardiaco reciente -alta presin sangunea -alto nivel de colesterol -enfermedad renal -enfermedad heptica -convulsiones -fuma tabaco -una reaccin alrgica o inusual al etonogestrel, otras hormonas, anestsicos o antispticos, medicamentos, alimentos, colorantes o conservantes -si est embarazada o buscando quedar embarazada -si est amamantando a un beb Cmo debo utilizar este medicamento? Este dispositivo se inserta debajo de la piel en la cara interna de la parte superior del brazo por un profesional de la salud. Hable con su pediatra para informarse acerca del uso de este medicamento en nios. Puede requerir atencin especial. Sobredosis: Pngase en contacto inmediatamente con un centro toxicolgico o una sala de urgencia si usted cree que haya tomado demasiado medicamento. ATENCIN: Este medicamento es solo para usted. No comparta este medicamento con nadie. Qu sucede si me olvido de una dosis? No se aplica en este caso. Qu puede interactuar con este medicamento? No tome esta medicina con ninguno de los siguientes medicamentos: -amprenavir -bosentano -fosamprenavir  Esta medicina tambin puede interactuar con los siguientes medicamentos: -medicamentos barbitricos  para inducir el sueo o tratar convulsiones -ciertos medicamentos para las infecciones micticas tales como quetoconazol e itraconazol -griseofulvina -medicamentos para tratar convulsiones, tales como carbamazepina, felbamato, oxcarbazepina, fenitona, topiramato -modafinil -fenilbutazona -rifampicina -algunos medicamentos para tratar la infeccin por VIH tales como atazanavir, indinavir, lopinavir, nelfinavir, tipranavir, ritonavir -hierba de [place] Puede ser que esta lista no menciona todas las posibles interacciones. Informe a su profesional de la salud de todos los productos a base de hierbas, medicamentos de venta libre o suplementos nutritivos que est tomando. Si usted fuma, consume bebidas alcohlicas o si utiliza drogas ilegales, indqueselo tambin a su profesional de la salud. Algunas sustancias pueden interactuar con su medicamento. A qu debo estar atento al usar este medicamento? Este medicamento no protege contra la infeccin por el VIH (SIDA) o otras enfermedades de transmisin sexual. Usted debe sentir el implante al presionar con las yemas de los dedos sobre la piel donde se insert. Dgale a su mdico si no se siente el implante. Qu efectos secundarios puedo tener al utilizar este medicamento? Efectos secundarios que debe informar a su mdico o a su profesional de la salud tan pronto como sea posible: -reacciones alrgicas como erupcin cutnea, picazn o urticarias, hinchazn de la cara, labios o lengua -ndulos mamarios -cambios en la visin -confusin, dificultad para hablar o entender -orina de color oscura -humor deprimido -sensacin general de estar enfermo o sntomas gripales -heces claras -prdida del apetito, nuseas -dolor en la regin abdominal superior derecha -dolores de cabeza severos -dolor, hinchazon o sensibilidad grave en el abdomen -falta de aliento, dolor en el pecho, hinchazn de la pierna -seales de un embarazo -entumecimiento o debilidad  repentina de la cara, brazo o pierna -dificultad para caminar, mareos, prdida de equilibrio o coordinacin -sangrado o flujo vaginal inusual -cansancio o debilidad inusual -color amarillento de los ojos o la piel  Efectos secundarios que,   por lo general, no requieren atencin mdica (debe informarlos a su mdico o a su profesional de la salud si persisten o si son molestos): -acn -dolor de pecho -cambios de peso -tos -fiebre o escalofros -dolor de cabeza -sangrado menstrual irregular -picazn, ardo o flujo vaginal -dolor o dificultad para orinar -dolor de garganta Puede ser que esta lista no menciona todos los posibles efectos secundarios. Comunquese a su mdico por asesoramiento mdico sobre los efectos secundarios. Usted puede informar los efectos secundarios a la FDA por telfono al 1-800-FDA-1088. Dnde debo guardar mi medicina? Este medicamento se administra en hospitales o clnicas y no necesitar guardarlo en su domicilio. ATENCIN: Este folleto es un resumen. Puede ser que no cubra toda la posible informacin. Si usted tiene preguntas acerca de esta medicina, consulte con su mdico, su farmacutico o su profesional de la salud.  2013, Elsevier/Gold Standard. (11/23/2011 11:44:43 AM)  

## 2013-03-07 NOTE — Progress Notes (Signed)
Patient ID: Judith King, female   DOB: 14-Jul-1991, 22 y.o.   MRN: [id number] Subjective:    Judith King is a 22 y.o. G55P1001 Hispanic female who presents for a postpartum visit. She is 6 weeks postpartum following a low cervical transverse Cesarean section. I have fully reviewed the prenatal and intrapartum course. The delivery was at 41.3 gestational weeks. Outcome: primary cesarean section, low transverse incision d/t FTP @9cm  despite adequate uc's, was an IOL d/t postdates, baby weighed 9lb9oz. Anesthesia: epidural. Postpartum course has been uneventful. Did have a malodorous d/c, but has gone away. Baby's course has been uneventful. Baby is feeding by breast and bottle.. Bleeding no bleeding. Bowel function is constipation- reviewed relief measures. Bladder function is normal. Patient is not sexually active. Contraception method is none, desires nexplanon, discussed r/b, wishes to proceed. Postpartum depression screening: 10, denies PPD, states she cries every now and then, able to sleep well, somewhat of a decreased appetite, no SI/HI/II, declines need for SSRI.  The following portions of the patient's history were reviewed and updated as appropriate: allergies, current medications, past medical history, past surgical history and problem list.  Review of Systems Pertinent items are noted in HPI.   Filed Vitals:   03/07/13 1407  BP: 110/64  Weight: 140 lb (63.504 kg)    Objective:     General:  alert, cooperative and no distress   Breasts:  deferred, no complaints  Lungs: clear to auscultation bilaterally  Heart:  regular rate and rhythm  Abdomen: soft, nontender, incision well healed, 2 external sutures trimmed   Vulva: normal  Vagina: normal vagina  Cervix:  closed  Corpus: Well-involuted  Adnexa:  Non-palpable  Rectal Exam: no hemorrhoids        Assessment:   Normal postpartum exam 6 wks s/p PLTCS for FTP at 9cm after IOL for postdates Depression  screening Contraception counseling   Plan:   Contraception: desires nexplanon Follow up in: asap for nexplanon insertion  To notify us if she decides she needs SSRI No sex until after nexplanon placed Reviewed constipation relief measures  Communication via [employer] [phone]  Cheral Marker, CNM, [facility] 03/07/2013 3:08 PM

## 2014-01-26 IMAGING — US US OB TRANSVAGINAL
1 series · 14 of 28 positions shown · non-contrast
Comparison: None.

CLINICAL DATA: Vomiting, pelvic pain

OBSTETRIC <14 WK US AND TRANSVAGINAL OB US
TECHNIQUE: Both transabdominal and transvaginal ultrasound
examinations were performed for complete evaluation of the
gestation as well as the maternal uterus, adnexal regions, and
pelvic cul-de-sac.  Transvaginal technique was performed to assess
early pregnancy.

[Series 1: us ob transvaginal · 0.18mm/px · 85 acquisitions, 14 frames shown]
[im 4/85]
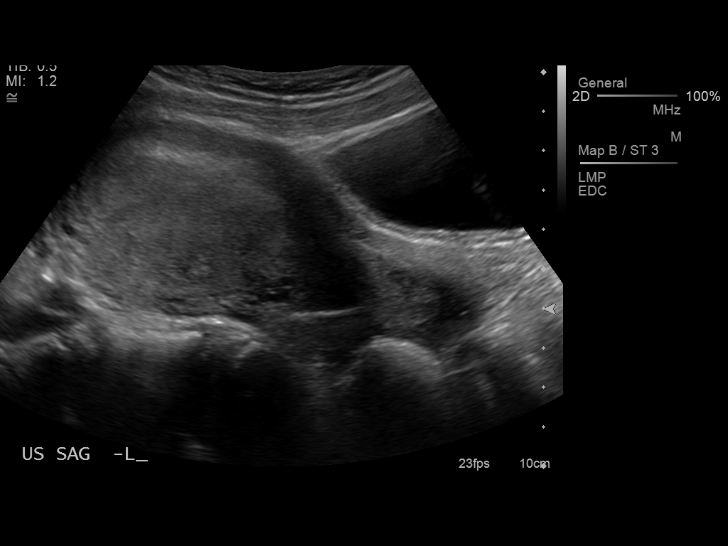
[im 10/85]
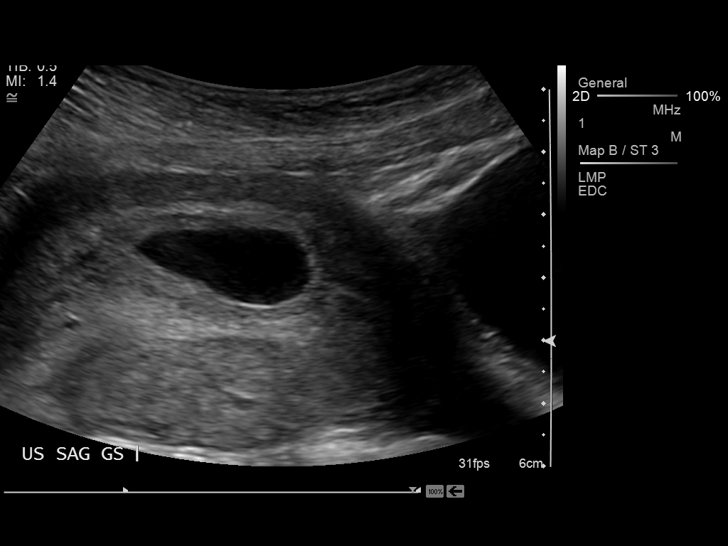
[im 16/85]
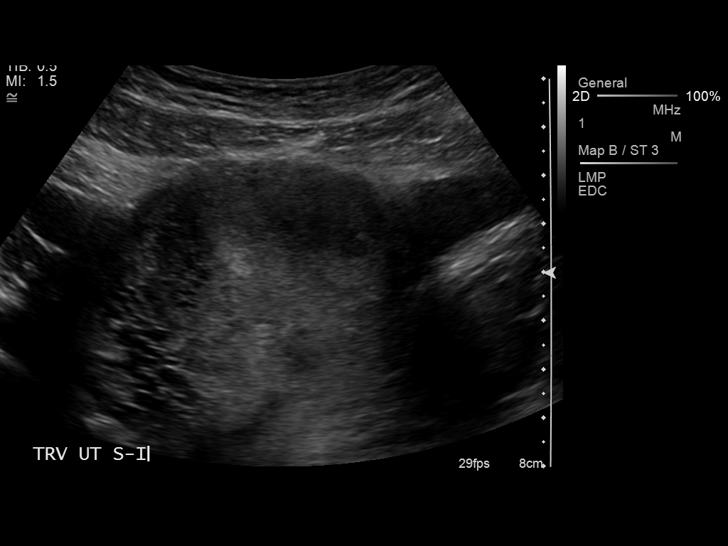
[im 22/85]
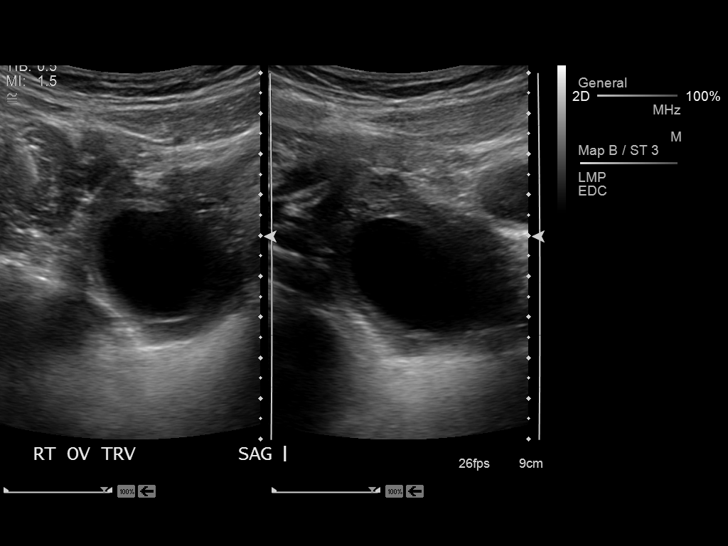
[im 29/85]
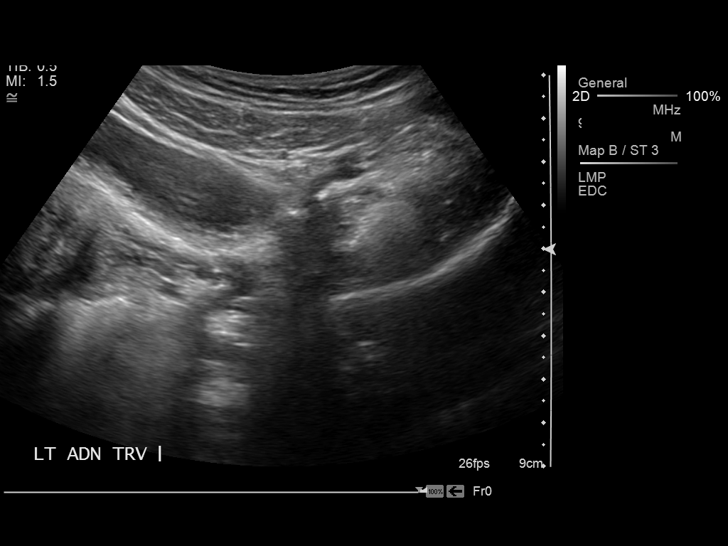
[im 35/85]
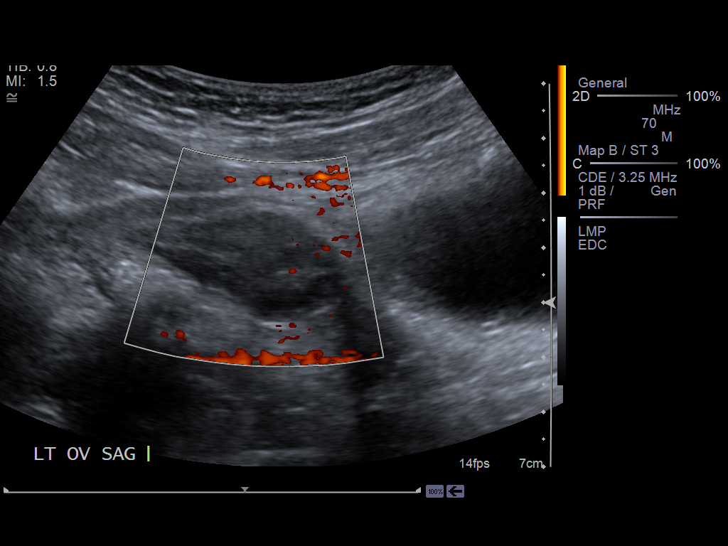
[im 41/85]
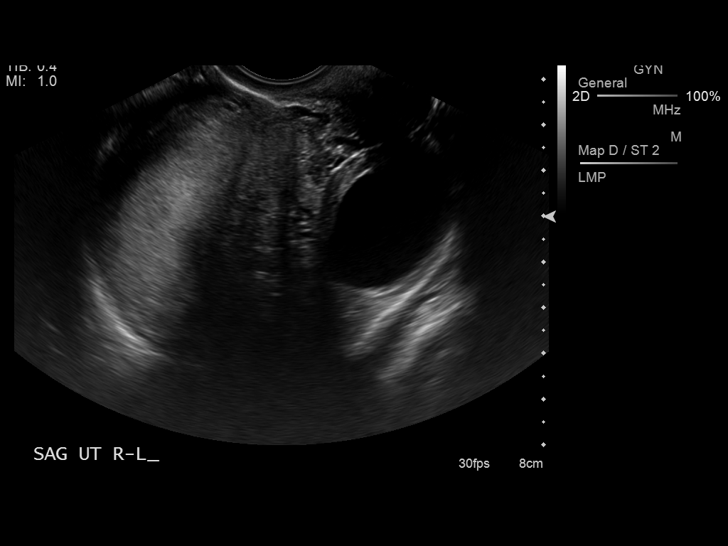
[im 47/85]
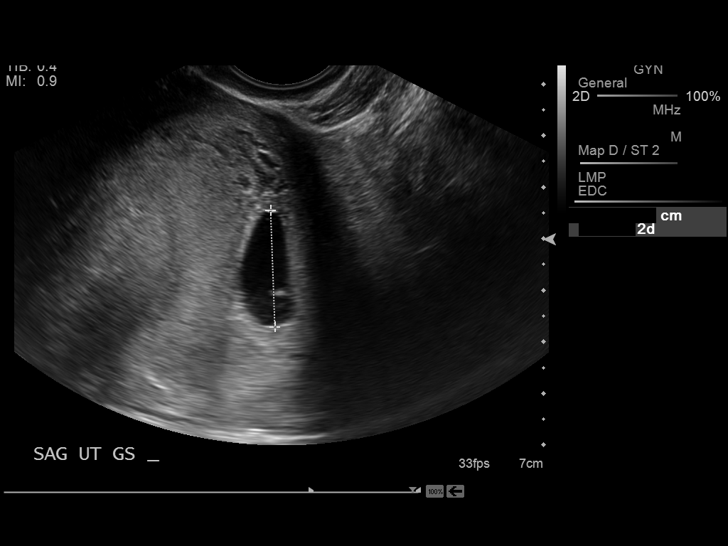
[im 53/85]
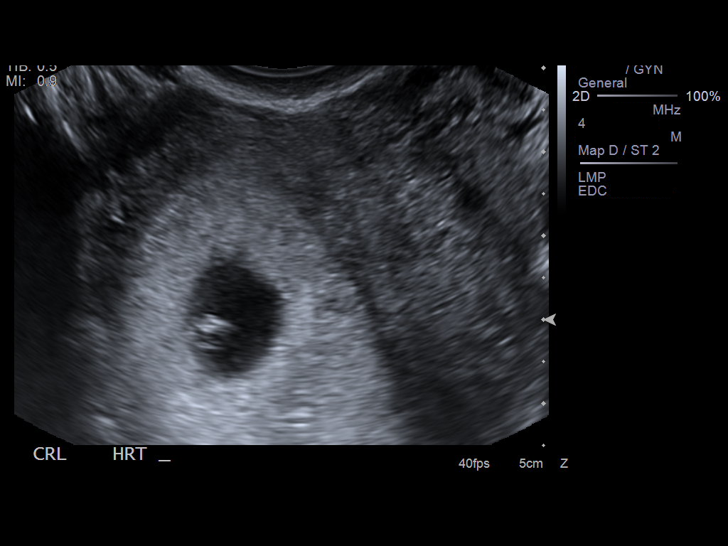
[im 60/85]
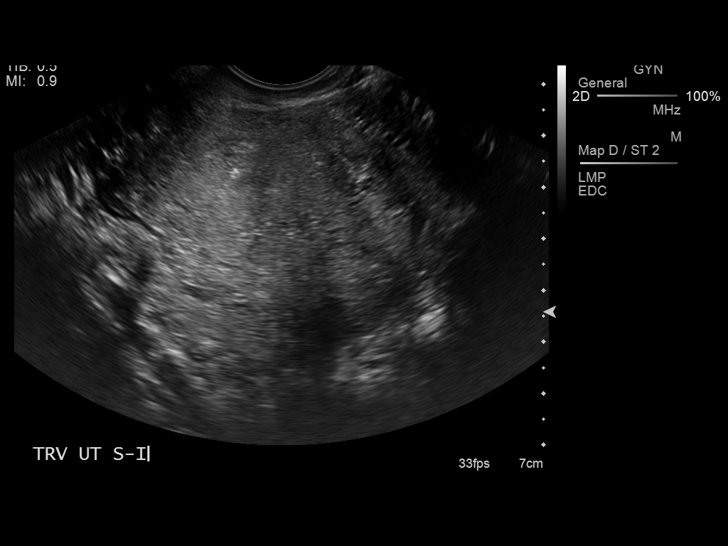
[im 66/85]
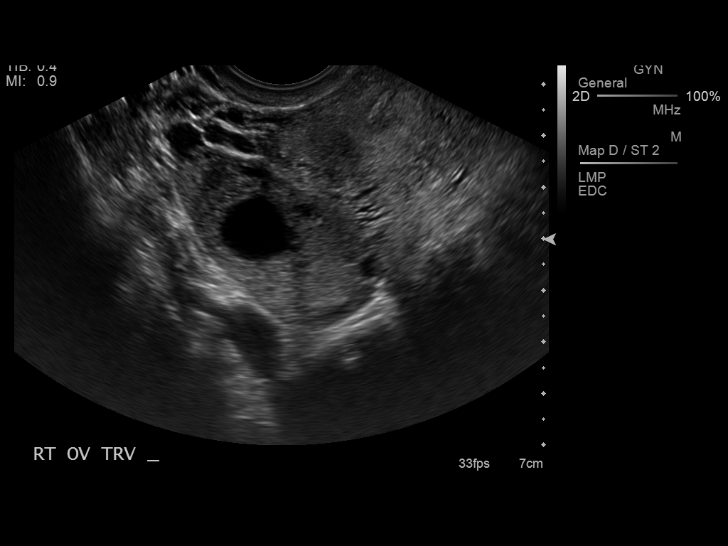
[im 72/85]
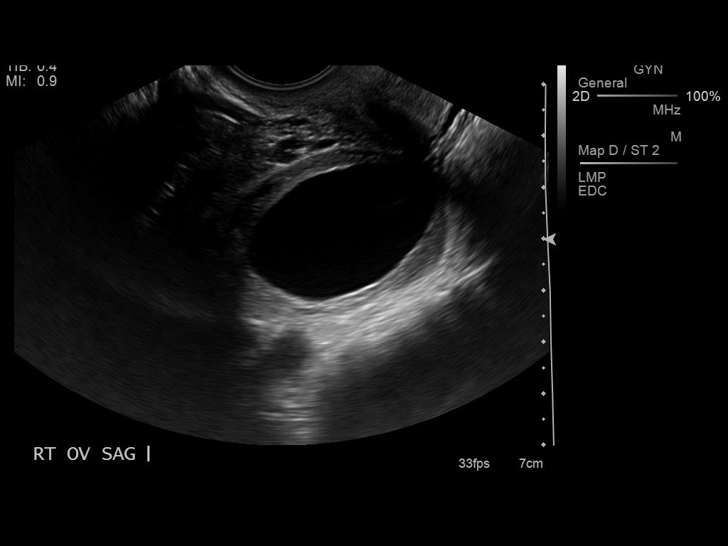
[im 78/85]
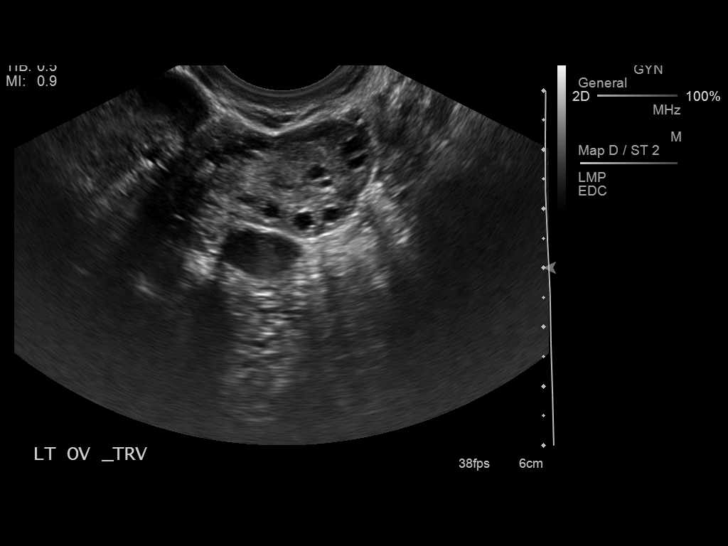
[im 85/85]
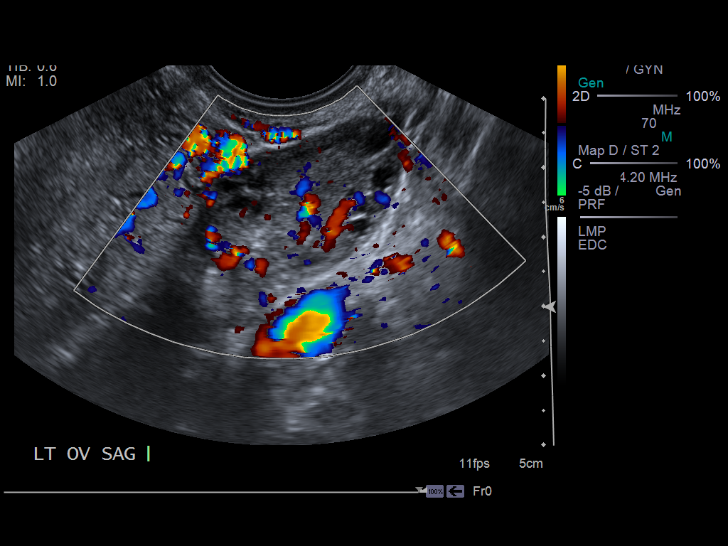

[14 of 28 positions shown; findings below may reference images not displayed]

Intrauterine gestational sac:  Visualized/normal in shape.
Yolk sac: Present
Embryo: Present
Cardiac Activity: Present
Heart Rate: 102 bpm

CRL: 3  mm  5 w  6 d          US EDC: 01/14/2013

Maternal uterus/adnexae:
Small subchronic hemorrhage.

Right ovary is notable for a 3.7 x 2.4 x 3.0 cm corpus luteal cyst.

Left ovary is within normal limits.
IMPRESSION: Single live intrauterine gestation with estimated gestational age 5
weeks 6 days by crown-rump length.

## 2014-02-23 IMAGING — US US OB COMP LESS 14 WK
1 series · 14 of 28 positions shown · non-contrast
Comparison: Ultrasound 05/20/2012

CLINICAL DATA: Vaginal bleeding, pregnant.

OBSTETRIC <14 WK US AND TRANSVAGINAL OB US
TECHNIQUE: Both transabdominal and transvaginal ultrasound
examinations were performed for complete evaluation of the
gestation as well as the maternal uterus, adnexal regions, and
pelvic cul-de-sac.  Transvaginal technique was performed to assess
early pregnancy.

[Series 1: us ob comp less 14 wk · 0.25mm/px · 14 of 95 slices shown]
[im 4/95]
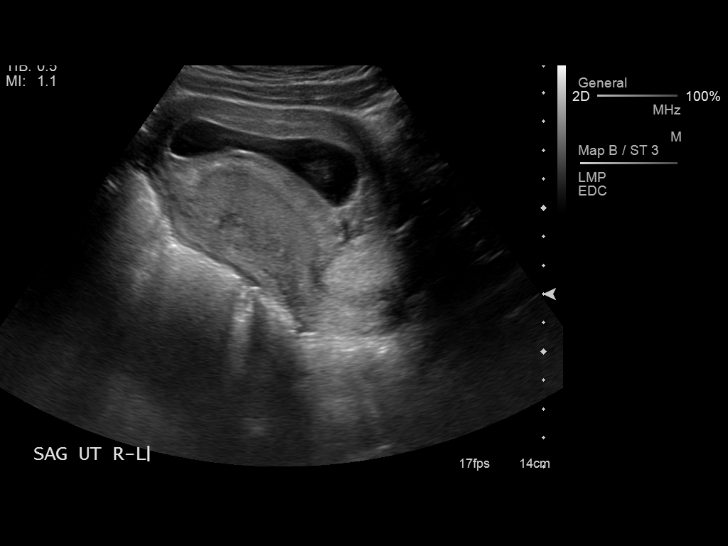
[im 11/95]
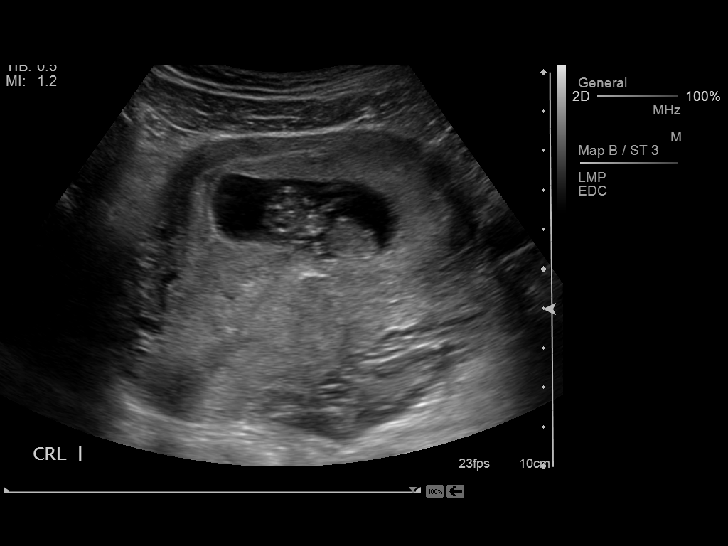
[im 18/95]
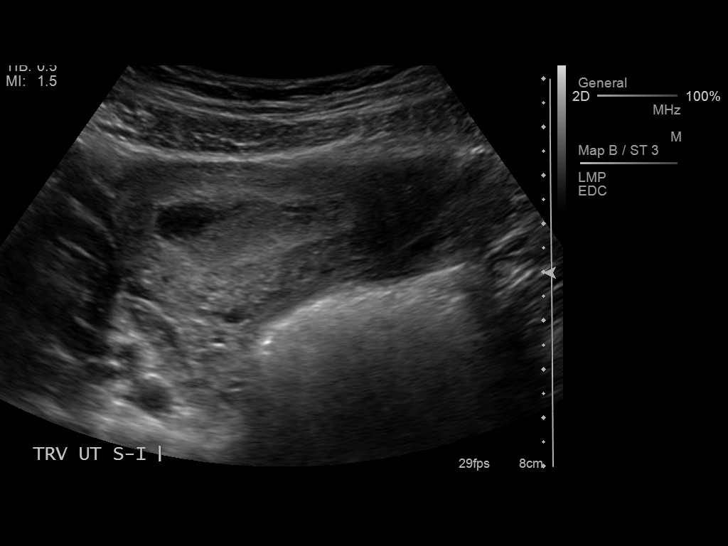
[im 25/95]
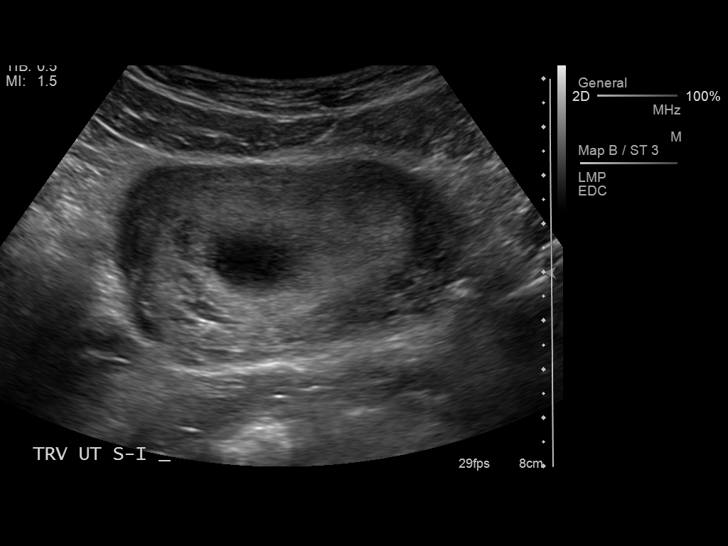
[im 32/95]
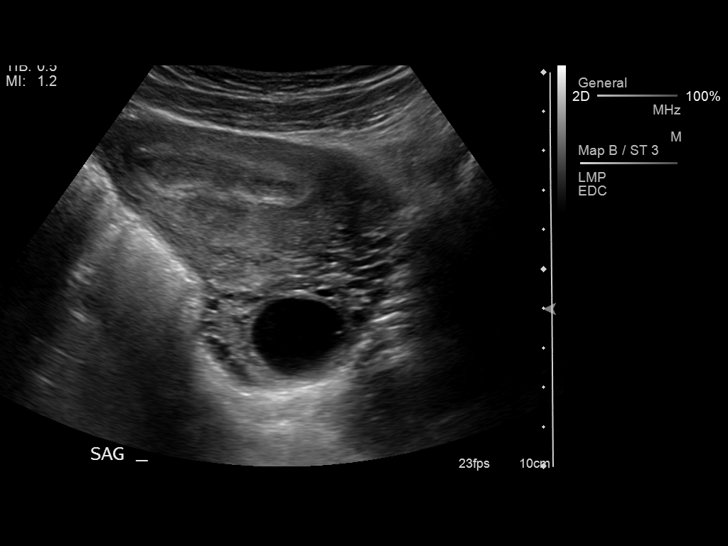
[im 39/95]
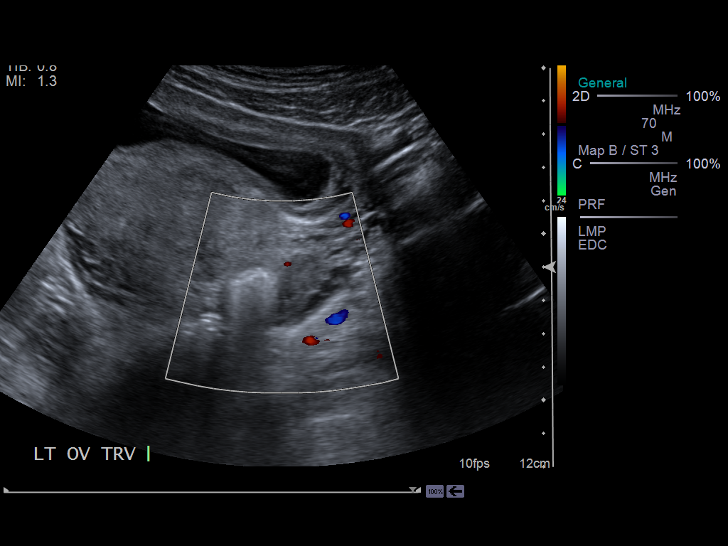
[im 46/95]
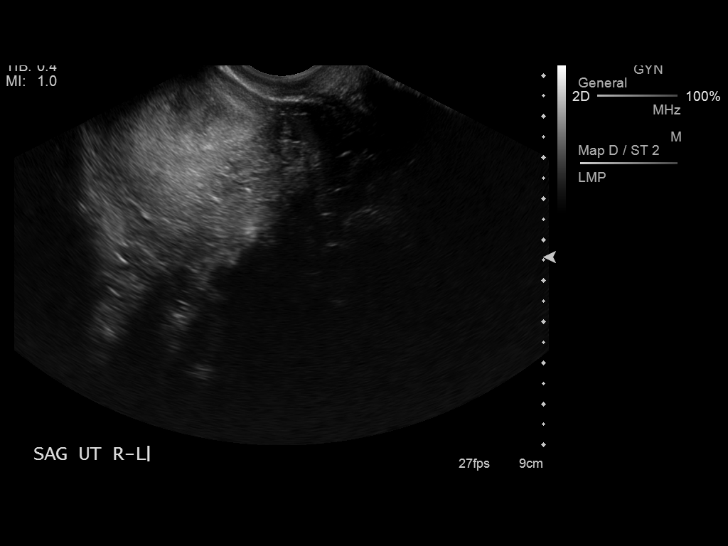
[im 53/95]
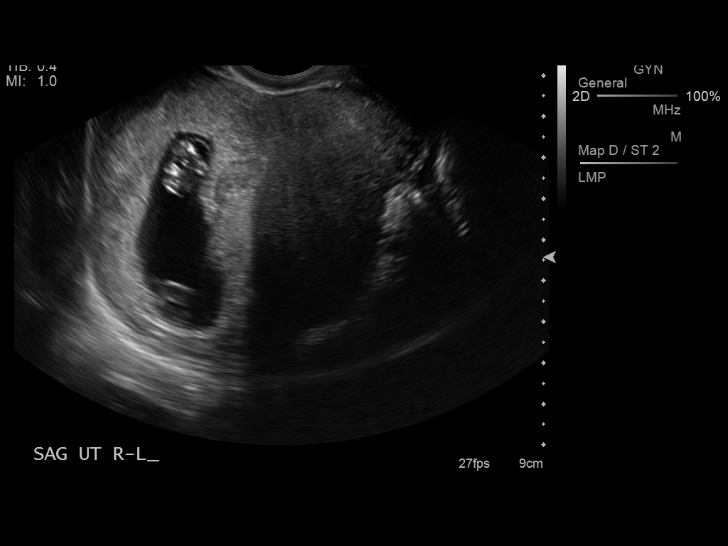
[im 60/95]
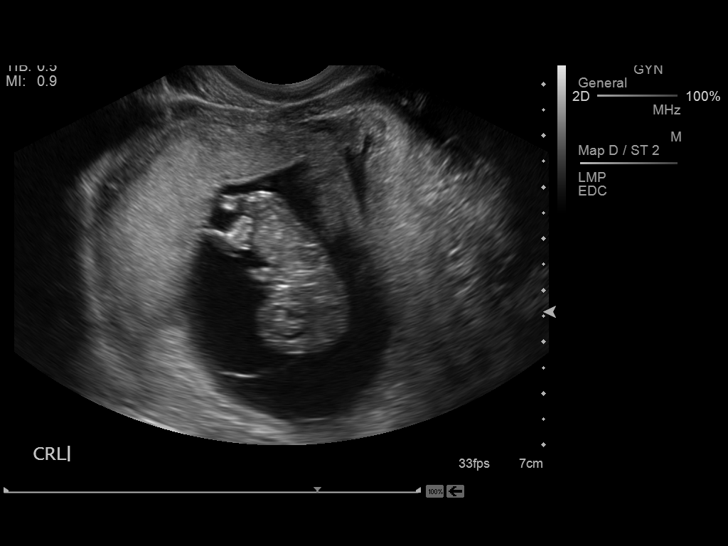
[im 67/95]
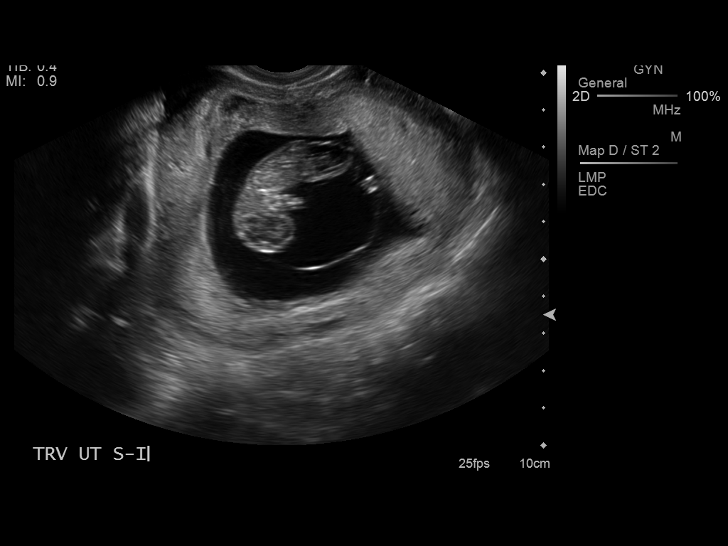
[im 74/95]
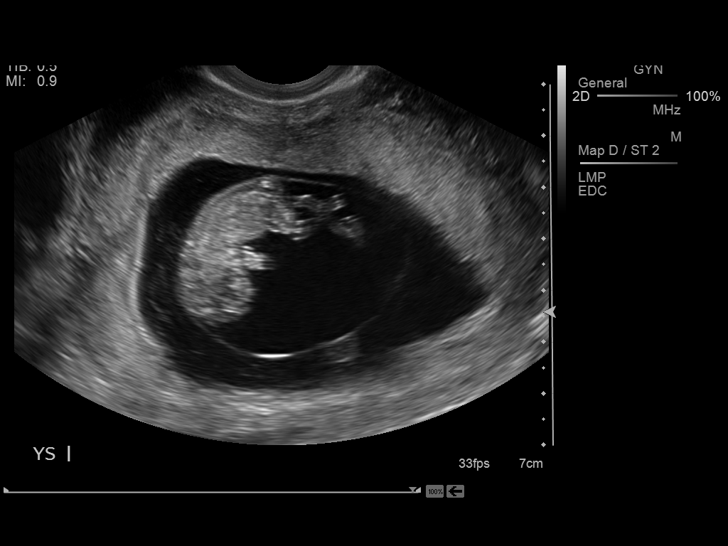
[im 81/95]
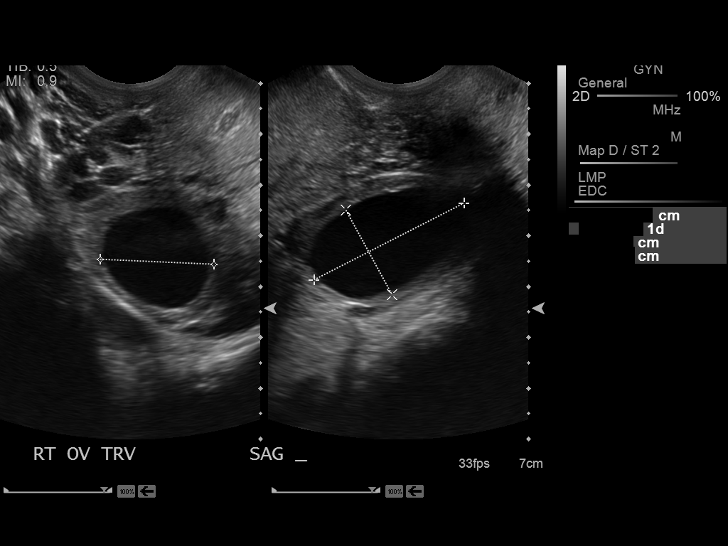
[im 88/95]
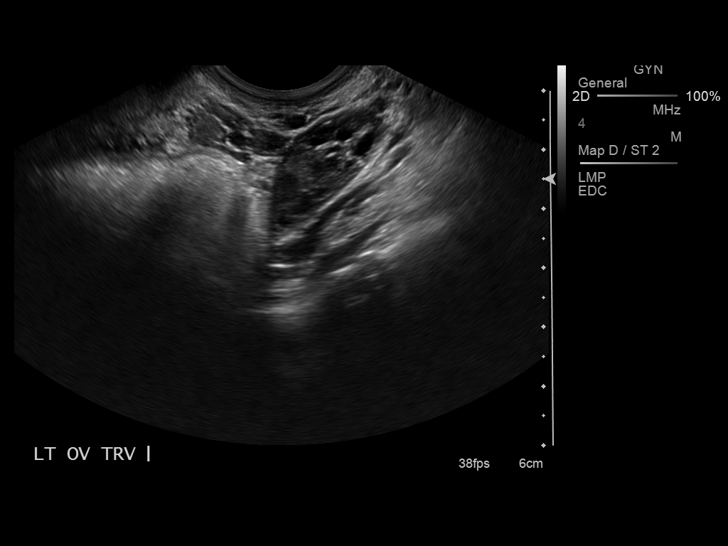
[im 95/95]
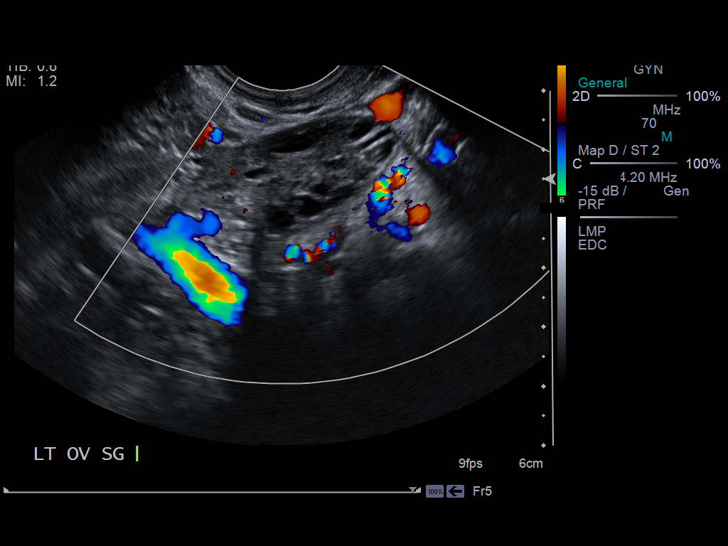

[14 of 28 positions shown; findings below may reference images not displayed]

Intrauterine gestational sac:  Single and present
Yolk sac: Present
Embryo: Present
Cardiac Activity: Present
Heart Rate: 171 bpm

CRL: 34  mm  10 w  2 d

Maternal uterus/adnexae:
Ovaries are normal. Corpus luteal cyst in the right ovary.  No free
fluid.  Small subchorionic hemorrhage is present.
IMPRESSION: 1.  Single intrauterine gestation with embryo and normal cardiac
activity.
2.  Estimate gestational age by crown-rump length equals 10 weeks 2
days.

3.  Small subchorionic hemorrhage.]

## 2014-06-10 ENCOUNTER — Encounter: Payer: Self-pay | Admitting: Women's Health

## 2015-08-12 ENCOUNTER — Other Ambulatory Visit: Payer: Self-pay | Admitting: Obstetrics & Gynecology

## 2015-08-12 DIAGNOSIS — O3680X0 Pregnancy with inconclusive fetal viability, not applicable or unspecified: Secondary | ICD-10-CM

## 2015-08-15 ENCOUNTER — Ambulatory Visit (INDEPENDENT_AMBULATORY_CARE_PROVIDER_SITE_OTHER): Payer: No Typology Code available for payment source

## 2015-08-15 DIAGNOSIS — O3491 Maternal care for abnormality of pelvic organ, unspecified, first trimester: Secondary | ICD-10-CM

## 2015-08-15 DIAGNOSIS — O3680X0 Pregnancy with inconclusive fetal viability, not applicable or unspecified: Secondary | ICD-10-CM

## 2015-08-15 DIAGNOSIS — Z3A01 Less than 8 weeks gestation of pregnancy: Secondary | ICD-10-CM

## 2015-08-15 NOTE — Progress Notes (Signed)
US 5+5 wks GS w/ys,normal lt ov,simple corpus luteal cyst rt ov 2.6 x 1.6 x 2.3cm,pt will come back for f/u ultrasound in 10 days per Victorino DikeJennifer.

## 2015-08-29 ENCOUNTER — Encounter: Payer: Self-pay | Admitting: Obstetrics & Gynecology

## 2015-08-29 ENCOUNTER — Ambulatory Visit (INDEPENDENT_AMBULATORY_CARE_PROVIDER_SITE_OTHER): Payer: Medicaid Other | Admitting: Adult Health

## 2015-08-29 ENCOUNTER — Other Ambulatory Visit (INDEPENDENT_AMBULATORY_CARE_PROVIDER_SITE_OTHER): Payer: No Typology Code available for payment source

## 2015-08-29 ENCOUNTER — Other Ambulatory Visit (HOSPITAL_COMMUNITY)
Admission: RE | Admit: 2015-08-29 | Discharge: 2015-08-29 | Disposition: A | Discharge status: Home / Self Care | Payer: Self-pay | Source: Ambulatory Visit | Attending: Adult Health | Admitting: Adult Health

## 2015-08-29 ENCOUNTER — Encounter: Payer: Self-pay | Admitting: Adult Health

## 2015-08-29 ENCOUNTER — Other Ambulatory Visit: Payer: Self-pay | Admitting: Obstetrics & Gynecology

## 2015-08-29 VITALS — BP 108/62 | HR 72 | Wt 137.0 lb

## 2015-08-29 DIAGNOSIS — Z3491 Encounter for supervision of normal pregnancy, unspecified, first trimester: Secondary | ICD-10-CM

## 2015-08-29 DIAGNOSIS — Z3A08 8 weeks gestation of pregnancy: Secondary | ICD-10-CM

## 2015-08-29 DIAGNOSIS — Z3682 Encounter for antenatal screening for nuchal translucency: Secondary | ICD-10-CM

## 2015-08-29 DIAGNOSIS — O3680X0 Pregnancy with inconclusive fetal viability, not applicable or unspecified: Secondary | ICD-10-CM | POA: Diagnosis not present

## 2015-08-29 DIAGNOSIS — Z331 Pregnant state, incidental: Secondary | ICD-10-CM | POA: Diagnosis not present

## 2015-08-29 DIAGNOSIS — Z1389 Encounter for screening for other disorder: Secondary | ICD-10-CM | POA: Diagnosis not present

## 2015-08-29 DIAGNOSIS — Z113 Encounter for screening for infections with a predominantly sexual mode of transmission: Secondary | ICD-10-CM | POA: Insufficient documentation

## 2015-08-29 DIAGNOSIS — Z01419 Encounter for gynecological examination (general) (routine) without abnormal findings: Secondary | ICD-10-CM | POA: Insufficient documentation

## 2015-08-29 DIAGNOSIS — Z3481 Encounter for supervision of other normal pregnancy, first trimester: Secondary | ICD-10-CM | POA: Diagnosis not present

## 2015-08-29 DIAGNOSIS — Z3490 Encounter for supervision of normal pregnancy, unspecified, unspecified trimester: Secondary | ICD-10-CM | POA: Insufficient documentation

## 2015-08-29 DIAGNOSIS — Z369 Encounter for antenatal screening, unspecified: Secondary | ICD-10-CM

## 2015-08-29 DIAGNOSIS — Z0283 Encounter for blood-alcohol and blood-drug test: Secondary | ICD-10-CM

## 2015-08-29 HISTORY — DX: Encounter for supervision of normal pregnancy, unspecified, first trimester: Z34.91

## 2015-08-29 LAB — POCT URINALYSIS DIPSTICK
GLUCOSE UA: NEGATIVE
Ketones, UA: NEGATIVE
Leukocytes, UA: NEGATIVE
Nitrite, UA: NEGATIVE
Protein, UA: NEGATIVE

## 2015-08-29 NOTE — Progress Notes (Signed)
Subjective:  Judith King is a 25 y.o. G58P1001 Hispanic female at [redacted]w[redacted]d by [place] being seen today for her first obstetrical visit.  Her obstetrical history is significant for had C-section for failure to progress.  Pregnancy history fully reviewed.  Patient reports nausea. Denies vb, cramping, uti s/s, abnormal/malodorous vag d/c, or vulvovaginal itching/irritation.  BP 108/62 mmHg  Pulse 72  Wt 137 lb (62.143 kg)  LMP 06/23/2015 (Exact Date)  HISTORY: OB History  Gravida Para Term Preterm AB SAB TAB Ectopic Multiple Living  0 0 0 0 0 0 1    # Outcome Date GA Lbr Len/2nd Weight Sex Delivery Anes PTL Lv  2 Current           1 Term 01/24/13 [redacted]w[redacted]d   F CS-LTranv Other  Y     Past Medical History  Diagnosis Date  . Medical history non-contributory   . Supervision of normal pregnancy in first trimester 08/29/2015     [facility]iated Care at    7+4 weeks FOB   [name] 25 yo HM Dating By  [place] Pap  GC/CT Initial:                36+wks: Genetic Screen NT/IT:  CF screen  Anatomic [place]  Flu vaccine  Tdap Recommended ~ 28wks Glucose Screen  2 hr GBS  Feed Preference  Contraception  Circumcision  Childbirth Classes  Pediatrician     Past Surgical History  Procedure Laterality Date  . No past surgeries    . Cesarean section N/A 01/24/2013    Procedure: CESAREAN SECTION;  Surgeon: Catalina Antigua, MD;  Location: [facility];  Service: Obstetrics;  Laterality: N/A;   Family History  Problem Relation Age of Onset  . Diabetes Other     Exam   System:     General: Well developed & nourished, no acute distress   Skin: Warm & dry, normal coloration and turgor, no rashes   Neurologic: Alert & oriented, normal mood   Cardiovascular: Regular rate & rhythm   Respiratory: Effort & rate normal, LCTAB, acyanotic   Abdomen: Soft, non tender   Extremities: normal strength, tone   Pelvic Exam:    Perineum: Normal perineum   Vulva: Normal, no lesions   Vagina:  Normal mucosa,  normal discharge   Cervix: Normal, bulbous, appears closed   Uterus: Normal size/shape/contour for GA   Thin prep pap smear with GC/CHL performed. FHR: 149  via [place]   Assessment:   Pregnancy: G2P1001 Patient Active Problem List   Diagnosis Date Noted  . Supervision of normal pregnancy in first trimester 08/29/2015  . Bacterial vaginosis 11/20/2012  . Supervision of normal first pregnancy 10/27/2012    [redacted]w[redacted]d G2P1001 New OB visit     Plan:  Initial labs drawn Continue prenatal vitamins Problem list reviewed and updated Reviewed n/v relief measures and warning s/s to report Reviewed recommended weight gain based on pre-gravid BMI Encouraged well-balanced diet Genetic Screening discussed Integrated Screen: requested Cystic fibrosis screening discussed requested Ultrasound discussed; fetal survey: requested Follow up in 4 weeks for IT/NT and see Dr Despina Hidden   Adline Potter, NP 08/29/2015 1:06 PM

## 2015-08-29 NOTE — Patient Instructions (Signed)
Primer trimestre de Media planner (First Trimester of Pregnancy) El primer trimestre de Media planner se extiende desde la semana1 hasta el final de la semana12 (mes1 al mes3). Una semana despus de que un espermatozoide fecunda un vulo, este se implantar en la pared uterina. Este embrin comenzar a Medical laboratory scientific officer convertirse en un beb. Sus genes y los de su pareja forman el beb. Los genes del varn determinan si ser un nio o una nia. Entre la semana6 y [place], se forman los ojos y [place], y los latidos del corazn pueden verse en la ecografa. Al final de las 12semanas, todos los rganos del beb estn formados.  Ahora que est embarazada, querr hacer todo lo que est a su alcance para tener un beb sano. Dos de las cosas ms importantes son Lucilla Edin buena atencin prenatal y seguir las indicaciones del mdico. La atencin prenatal incluye toda la asistencia mdica que usted recibe antes del nacimiento del beb. Esta ayudar a prevenir, Hydrographic surveyor y tratar cualquier problema durante el embarazo y [place]. CAMBIOS EN EL ORGANISMO Su organismo atraviesa por muchos cambios durante el [place], y estos varan de Ardelia Mems mujer a Theatre manager.   Al principio, puede aumentar o bajar algunos kilos.  Puede tener Higher education careers adviser (nuseas) y vomitar. Si no puede controlar los vmitos, llame al mdico.  Puede cansarse con facilidad.  Es posible que tenga dolores de cabeza que pueden aliviarse con los medicamentos que el mdico le permita tomar.  Puede orinar con mayor frecuencia. El dolor al orinar puede significar que usted tiene una infeccin de la vejiga.  Debido al Glennis Brink, puede tener acidez estomacal.  Puede estar estreida, ya que ciertas hormonas enlentecen los movimientos de los msculos que [employer] desechos a travs de los intestinos.  Pueden aparecer hemorroides o abultarse e hincharse las venas (venas varicosas).  Las [employer] pueden empezar a Engineer, site y Scientist, forensic. Los pezones  pueden sobresalir ms, y el tejido que los rodea (areola) tornarse ms oscuro.  Las Production manager y estar sensibles al cepillado y al hilo dental.  Pueden aparecer zonas oscuras o manchas (cloasma, mscara del Media planner) en el rostro que probablemente se atenuarn despus del nacimiento del beb.  Los perodos menstruales se interrumpirn.  Tal vez no tenga apetito.  Puede sentir un fuerte deseo de consumir ciertos alimentos.  Puede tener cambios a Engineer, site a da, por ejemplo, por momentos puede estar emocionada por el Media planner y por otros preocuparse porque algo pueda salir mal con el embarazo o el beb.  Tendr sueos ms vvidos y extraos.  Tal vez haya cambios en el cabello que pueden incluir su engrosamiento, crecimiento rpido y cambios en la textura. A algunas mujeres tambin se les cae el cabello durante o despus del [place], o tienen el cabello seco o fino. Lo ms probable es que el cabello se le normalice despus del nacimiento del beb. QU DEBE ESPERAR EN LAS CONSULTAS PRENATALES Durante una visita prenatal de rutina:  La pesarn para asegurarse de que usted y el beb estn creciendo normalmente.  Le controlarn la presin arterial.  Le medirn el abdomen para controlar el desarrollo del beb.  Se escucharn los latidos cardacos a partir de la semana10 o la12 de embarazo, aproximadamente.  Se analizarn los resultados de los estudios solicitados en visitas anteriores. El mdico puede preguntarle:  Cmo se siente.  Si siente los movimientos del beb.  Si ha tenido [employer], como prdida de lquido, [place], dolores de cabeza intensos o  médico puede preguntarle:  · Cómo se siente.  · Si siente los movimientos del bebé.  · Si ha tenido síntomas anormales, como pérdida de líquido, sangrado, dolores de cabeza intensos o cólicos abdominales.  · Si está consumiendo algún producto que contenga tabaco, como cigarrillos, tabaco de mascar y cigarrillos electrónicos.  · Si tiene alguna pregunta.  Otros estudios que pueden realizarse durante el primer trimestre incluyen lo siguiente:  · Análisis de sangre para determinar el tipo  de sangre y detectar la presencia de infecciones previas. Además, se los usará para controlar si los niveles de hierro son bajos (anemia) y determinar los anticuerpos Rh. En una etapa más avanzada del embarazo, se harán análisis de sangre para saber si tiene diabetes, junto con otros estudios si surgen problemas.  · Análisis de orina para detectar infecciones, diabetes o proteínas en la orina.  · Una ecografía para confirmar que el bebé crece y se desarrolla correctamente.  · Una amniocentesis para diagnosticar posibles problemas genéticos.  · Estudios del feto para descartar espina bífida y síndrome de Down.  · Es posible que necesite otras pruebas adicionales.  · Prueba del VIH (virus de inmunodeficiencia humana). Los exámenes prenatales de rutina incluyen la prueba de detección del VIH, a menos que decida no realizársela.  INSTRUCCIONES PARA EL CUIDADO EN EL HOGAR   Medicamentos:  · Siga las indicaciones del médico en relación con el uso de medicamentos. Durante el embarazo, hay medicamentos que pueden tomarse y otros que no.  · Tome las vitaminas prenatales como se le indicó.  · Si está estreñida, tome un laxante suave, si el médico lo autoriza.  Dieta  · Consuma alimentos balanceados. Elija alimentos variados, como carne o proteínas de origen vegetal, pescado, leche y productos lácteos descremados, verduras, frutas y panes y cereales integrales. El médico la ayudará a determinar la cantidad de peso que puede aumentar.  · No coma carne cruda ni quesos sin cocinar. Estos elementos contienen bacterias que pueden causar defectos congénitos en el bebé.  · La ingesta diaria de cuatro o cinco comidas pequeñas en lugar de tres comidas abundantes puede ayudar a aliviar las náuseas y los vómitos. Si empieza a tener náuseas, comer algunas galletas saladas puede ser de ayuda. Beber líquidos entre las comidas en lugar de tomarlos durante las comidas también puede ayudar a calmar las náuseas y los vómitos.  · Si está  estreñida, consuma alimentos con alto contenido de fibra, como verduras y frutas frescas, y cereales integrales. Beba suficiente líquido para mantener la orina clara o de color amarillo pálido.  Actividad y ejercicios  · Haga ejercicio solamente como se lo haya indicado el médico. El ejercicio la ayudará a:    Controlar el peso.    Mantenerse en forma.    Estar preparada para el trabajo de parto y el parto.  · Los dolores, los cólicos en la parte baja del abdomen o los calambres en la cintura son un buen indicio de que debe dejar de hacer ejercicios. Consulte al médico antes de seguir haciendo ejercicios normales.  · Intente no estar de pie durante mucho tiempo. Mueva las piernas con frecuencia si debe estar de pie en un lugar durante mucho tiempo.  · Evite levantar pesos excesivos.  · Use zapatos de tacones bajos y mantenga una buena postura.  · Puede seguir teniendo relaciones sexuales, excepto que el médico le indique lo contrario.  Alivio del dolor o las molestias  · Use un sostén que le brinde buen   3 o 4veces por da. Limite la cantidad de sal en su dieta. Cuidados prenatales  Programe las visitas prenatales para la semana12 de [place]. Generalmente se programan cada mes al principio y se hacen ms frecuentes en los 2 ltimos meses antes del parto.  Escriba sus preguntas. Llvelas cuando concurra a las visitas prenatales.  Concurra a todas las visitas prenatales como se lo haya indicado el mdico. Seguridad  Colquese el cinturn de seguridad cuando conduzca.  Haga una lista de los nmeros de telfono de  Associate Professor, que [employer] nmeros de telfono de familiares, [place], el hospital y los departamentos de polica y bomberos. Consejos generales  Pdale al mdico que la derive a clases de educacin prenatal en su localidad. Debe comenzar a tomar las clases antes de Cytogeneticist en el mes6 de embarazo.  Pida ayuda si tiene necesidades nutricionales o de asesoramiento Academic librarian. El mdico puede aconsejarla o derivarla a especialistas para que la ayuden con diferentes necesidades.  No se d baos de inmersin en agua caliente, baos turcos ni saunas.  No se haga duchas vaginales ni use tampones o toallas higinicas perfumadas.  No mantenga las piernas cruzadas durante [place].  Evite el contacto con las bandejas sanitarias de los gatos y la tierra que estos animales usan. Estos elementos contienen bacterias que pueden causar defectos congnitos al beb y la posible prdida del feto debido a un aborto espontneo o muerte fetal.  No fume, no consuma hierbas ni medicamentos que no hayan sido recetados por el mdico. Las sustancias qumicas que estos productos contienen afectan la formacin y el desarrollo del beb.  No consuma ningn producto que contenga tabaco, lo que incluye cigarrillos, tabaco de Theatre manager y Administrator, Civil Service. Si necesita ayuda para dejar de fumar, consulte al [employer]. Puede recibir asesoramiento y otro tipo de recursos para dejar de fumar.  Programe una cita con el dentista. En su casa, lvese los dientes con un cepillo dental blando y psese el hilo dental con suavidad. SOLICITE ATENCIN MDICA SI:   Tiene mareos.  Siente clicos leves, presin en la pelvis o dolor persistente en el abdomen.  Tiene nuseas, vmitos o diarrea persistentes.  Tiene secrecin vaginal con mal olor.  Siente dolor al [employer].  Tiene el rostro, las [place], las piernas o los tobillos ms hinchados. SOLICITE ATENCIN MDICA DE INMEDIATO SI:   Tiene fiebre.  Tiene una prdida de  lquido por la vagina.  Tiene sangrado o pequeas prdidas vaginales.  Siente dolor intenso o clicos en el abdomen.  Sube o baja de peso rpidamente.  Vomita sangre de color rojo brillante o material que parezca granos de caf.  Ha estado expuesta a la rubola y no ha sufrido la enfermedad.  Ha estado expuesta a la quinta enfermedad o a la varicela.  Tiene un dolor de cabeza intenso.  Le falta el aire.  Sufre cualquier tipo de traumatismo, por ejemplo, debido a una cada o un accidente automovilstico.   Esta informacin no tiene Theme park manager el consejo del mdico. Asegrese de hacerle al mdico cualquier pregunta que tenga.   Document Released: 05/05/2005 Document Revised: 08/16/2014 Elsevier Interactive Patient Education [employer] Return in 4 weeks for [place]

## 2015-08-29 NOTE — Progress Notes (Signed)
[place] 7+4wks single IUP w/ys,pos fht 149 bpm,crl 12.67mm,normal lt ov,simple corpus luteal cyst rt ov 3.1 x 2.7 x 3.7cm,subchorionic hemorrhage 1.1 x .9 x 1.2cm

## 2015-08-31 LAB — URINE CULTURE: Organism ID, Bacteria: NO GROWTH

## 2015-09-04 LAB — CYTOLOGY - PAP

## 2015-09-08 LAB — PMP SCREEN PROFILE (10S), URINE
Amphetamine Screen, Ur: NEGATIVE ng/mL
BARBITURATE SCRN UR: NEGATIVE ng/mL
BENZODIAZEPINE SCREEN, URINE: NEGATIVE ng/mL
CREATININE(CRT), U: 101 mg/dL (ref 20.0–300.0)
Cannabinoids Ur Ql Scn: NEGATIVE ng/mL
Cocaine(Metab.)Screen, Urine: NEGATIVE ng/mL
METHADONE SCREEN, URINE: NEGATIVE ng/mL
OPIATE SCRN UR: NEGATIVE ng/mL
Oxycodone+Oxymorphone Ur Ql Scn: NEGATIVE ng/mL
PCP Scrn, Ur: NEGATIVE ng/mL
PH UR, DRUG SCRN: 6.6 (ref 4.5–8.9)
PROPOXYPHENE SCREEN: NEGATIVE ng/mL

## 2015-09-08 LAB — VARICELLA ZOSTER ANTIBODY, IGG: Varicella zoster IgG: 303 {index}

## 2015-09-08 LAB — CBC
HEMATOCRIT: 36.2 % (ref 34.0–46.6)
HEMOGLOBIN: 12.4 g/dL (ref 11.1–15.9)
MCH: 31 pg (ref 26.6–33.0)
MCHC: 34.3 g/dL (ref 31.5–35.7)
MCV: 91 fL (ref 79–97)
Platelets: 276 10*3/uL (ref 150–379)
RBC: 4 x10E6/uL (ref 3.77–5.28)
RDW: 14.5 % (ref 12.3–15.4)
WBC: 11.8 10*3/uL — ABNORMAL HIGH (ref 3.4–10.8)

## 2015-09-08 LAB — URINALYSIS, ROUTINE W REFLEX MICROSCOPIC
BILIRUBIN UA: NEGATIVE
Glucose, UA: NEGATIVE
Ketones, UA: NEGATIVE
LEUKOCYTES UA: NEGATIVE
Nitrite, UA: NEGATIVE
PH UA: 7 (ref 5.0–7.5)
Protein, UA: NEGATIVE
RBC UA: NEGATIVE
Specific Gravity, UA: 1.022 (ref 1.005–1.030)
Urobilinogen, Ur: 1 mg/dL (ref 0.2–1.0)

## 2015-09-08 LAB — CYSTIC FIBROSIS MUTATION 97: GENE DIS ANAL CARRIER INTERP BLD/T-IMP: NOT DETECTED

## 2015-09-08 LAB — HEPATITIS B SURFACE ANTIGEN: Hepatitis B Surface Ag: NEGATIVE

## 2015-09-08 LAB — RUBELLA SCREEN: Rubella Antibodies, IGG: 1.82 index (ref >0.99)

## 2015-09-08 LAB — ANTIBODY SCREEN: Antibody Screen: NEGATIVE

## 2015-09-08 LAB — ABO/RH: Rh Factor: POSITIVE

## 2015-09-08 LAB — HIV ANTIBODY (ROUTINE TESTING W REFLEX): HIV Screen 4th Generation wRfx: NONREACTIVE

## 2015-09-08 LAB — RPR: RPR Ser Ql: NONREACTIVE

## 2015-09-26 ENCOUNTER — Encounter: Payer: Self-pay | Admitting: Obstetrics & Gynecology

## 2015-09-26 ENCOUNTER — Ambulatory Visit (INDEPENDENT_AMBULATORY_CARE_PROVIDER_SITE_OTHER): Payer: No Typology Code available for payment source

## 2015-09-26 ENCOUNTER — Ambulatory Visit (INDEPENDENT_AMBULATORY_CARE_PROVIDER_SITE_OTHER): Payer: Medicaid Other | Admitting: Obstetrics & Gynecology

## 2015-09-26 VITALS — BP 120/70 | HR 74 | Wt 133.0 lb

## 2015-09-26 DIAGNOSIS — Z36 Encounter for antenatal screening of mother: Secondary | ICD-10-CM | POA: Diagnosis not present

## 2015-09-26 DIAGNOSIS — Z3481 Encounter for supervision of other normal pregnancy, first trimester: Secondary | ICD-10-CM

## 2015-09-26 DIAGNOSIS — Z1389 Encounter for screening for other disorder: Secondary | ICD-10-CM

## 2015-09-26 DIAGNOSIS — Z331 Pregnant state, incidental: Secondary | ICD-10-CM

## 2015-09-26 DIAGNOSIS — Z3491 Encounter for supervision of normal pregnancy, unspecified, first trimester: Secondary | ICD-10-CM

## 2015-09-26 DIAGNOSIS — Z3A12 12 weeks gestation of pregnancy: Secondary | ICD-10-CM | POA: Diagnosis not present

## 2015-09-26 DIAGNOSIS — Z3682 Encounter for antenatal screening for nuchal translucency: Secondary | ICD-10-CM

## 2015-09-26 LAB — POCT URINALYSIS DIPSTICK
GLUCOSE UA: NEGATIVE
Ketones, UA: NEGATIVE
Leukocytes, UA: NEGATIVE
NITRITE UA: NEGATIVE
Protein, UA: NEGATIVE
RBC UA: NEGATIVE

## 2015-09-26 NOTE — Progress Notes (Signed)
G2P1001 [redacted]w[redacted]d Estimated Date of Delivery: 04/12/16  Blood pressure 120/70, pulse 74, weight 133 lb (60.328 kg), last menstrual period 06/23/2015, unknown if currently breastfeeding.   BP weight and urine results all reviewed and noted.  Please refer to the obstetrical flow sheet for the fundal height and fetal heart rate documentation:  Patient reports good fetal movement, denies any bleeding and no rupture of membranes symptoms or regular contractions. Patient is without complaints. All questions were answered.  Orders Placed This Encounter  Procedures  . Maternal Screen, Integrated #1  . POCT urinalysis dipstick    Plan:  Continued routine obstetrical care, NT sonogram is normal  No Follow-up on file.

## 2015-09-26 NOTE — Progress Notes (Signed)
[place] 11+4wks,measurements c/w dates,crl 50.88mm,NB present,NT 1.37mm,normal ov's bilat,post pl,fhr 159bpm

## 2015-09-29 LAB — MATERNAL SCREEN, INTEGRATED #1
CROWN RUMP LENGTH MAT SCREEN: 50.4 mm
Gest. Age on Collection Date: 11.7 weeks
Maternal Age at EDD: 25.8 years
Nuchal Translucency (NT): 1.4 mm
Number of Fetuses: 1
PAPP-A Value: 325.2 ng/mL
Weight: 133 [lb_av]

## 2015-10-23 ENCOUNTER — Encounter: Payer: Self-pay | Admitting: Obstetrics & Gynecology

## 2015-11-07 ENCOUNTER — Encounter: Payer: No Typology Code available for payment source | Admitting: Obstetrics & Gynecology

## 2015-11-13 ENCOUNTER — Encounter: Payer: No Typology Code available for payment source | Admitting: Obstetrics & Gynecology

## 2015-11-20 ENCOUNTER — Ambulatory Visit (INDEPENDENT_AMBULATORY_CARE_PROVIDER_SITE_OTHER): Payer: No Typology Code available for payment source | Admitting: Obstetrics & Gynecology

## 2015-11-20 ENCOUNTER — Encounter: Payer: Self-pay | Admitting: Obstetrics & Gynecology

## 2015-11-20 VITALS — BP 108/68 | HR 87 | Wt 138.0 lb

## 2015-11-20 DIAGNOSIS — Z3492 Encounter for supervision of normal pregnancy, unspecified, second trimester: Secondary | ICD-10-CM

## 2015-11-20 DIAGNOSIS — Z331 Pregnant state, incidental: Secondary | ICD-10-CM

## 2015-11-20 DIAGNOSIS — Z1389 Encounter for screening for other disorder: Secondary | ICD-10-CM

## 2015-11-20 DIAGNOSIS — Z3491 Encounter for supervision of normal pregnancy, unspecified, first trimester: Secondary | ICD-10-CM

## 2015-11-20 DIAGNOSIS — Z369 Encounter for antenatal screening, unspecified: Secondary | ICD-10-CM

## 2015-11-20 LAB — POCT URINALYSIS DIPSTICK
Glucose, UA: NEGATIVE
KETONES UA: NEGATIVE
LEUKOCYTES UA: NEGATIVE
Nitrite, UA: NEGATIVE
RBC UA: NEGATIVE

## 2015-11-20 NOTE — Progress Notes (Signed)
G2P1001 1531w3d Estimated Date of Delivery: 04/12/16  Blood pressure 108/68, pulse 87, weight 138 lb (62.596 kg), last menstrual period 06/23/2015, unknown if currently breastfeeding.   BP weight and urine results all reviewed and noted.  Please refer to the obstetrical flow sheet for the fundal height and fetal heart rate documentation:  Patient reports good fetal movement, denies any bleeding and no rupture of membranes symptoms or regular contractions. Patient is without complaints. All questions were answered.  Orders Placed This Encounter  Procedures  . POCT urinalysis dipstick    Plan:  Continued routine obstetrical care, missed March appointment, will draw 2nd IT today and schedule her sonogram  No Follow-up on file.

## 2015-11-22 LAB — MATERNAL SCREEN, INTEGRATED #2
ADSF: 0.85
AFP MoM: 1.03
Alpha-Fetoprotein: 55.6 ng/mL
CROWN RUMP LENGTH: 50.4 mm
DIA MoM: 0.91
DIA VALUE: 193 pg/mL
ESTRIOL UNCONJUGATED: 1.54 ng/mL
GEST. AGE ON COLLECTION DATE: 11.7 wk
Gestational Age: 19.6 weeks
HCG VALUE: 16.5 [IU]/mL
MATERNAL AGE AT EDD: 25.8 a
NUCHAL TRANSLUCENCY (NT): 1.4 mm
NUMBER OF FETUSES: 1
Nuchal Translucency MoM: 1.1
PAPP-A MoM: 0.39
PAPP-A VALUE: 325.2 ng/mL
Test Results:: NEGATIVE
Weight: 133 [lb_av]
Weight: 133 [lb_av]
hCG MoM: 0.7

## 2015-11-25 ENCOUNTER — Other Ambulatory Visit: Payer: Self-pay | Admitting: Obstetrics & Gynecology

## 2015-11-25 DIAGNOSIS — Z1389 Encounter for screening for other disorder: Secondary | ICD-10-CM

## 2015-11-26 ENCOUNTER — Ambulatory Visit (INDEPENDENT_AMBULATORY_CARE_PROVIDER_SITE_OTHER): Payer: No Typology Code available for payment source

## 2015-11-26 ENCOUNTER — Encounter: Payer: Self-pay | Admitting: Obstetrics and Gynecology

## 2015-11-26 ENCOUNTER — Ambulatory Visit (INDEPENDENT_AMBULATORY_CARE_PROVIDER_SITE_OTHER): Payer: No Typology Code available for payment source | Admitting: Obstetrics and Gynecology

## 2015-11-26 VITALS — BP 100/62 | HR 74 | Wt 138.0 lb

## 2015-11-26 DIAGNOSIS — Z1389 Encounter for screening for other disorder: Secondary | ICD-10-CM

## 2015-11-26 DIAGNOSIS — Z36 Encounter for antenatal screening of mother: Secondary | ICD-10-CM | POA: Diagnosis not present

## 2015-11-26 DIAGNOSIS — Z3A21 21 weeks gestation of pregnancy: Secondary | ICD-10-CM

## 2015-11-26 DIAGNOSIS — Z3402 Encounter for supervision of normal first pregnancy, second trimester: Secondary | ICD-10-CM | POA: Diagnosis not present

## 2015-11-26 DIAGNOSIS — Z331 Pregnant state, incidental: Secondary | ICD-10-CM

## 2015-11-26 DIAGNOSIS — Z3491 Encounter for supervision of normal pregnancy, unspecified, first trimester: Secondary | ICD-10-CM

## 2015-11-26 LAB — POCT URINALYSIS DIPSTICK
Glucose, UA: NEGATIVE
Ketones, UA: NEGATIVE
Leukocytes, UA: NEGATIVE
NITRITE UA: NEGATIVE
PROTEIN UA: NEGATIVE
RBC UA: NEGATIVE

## 2015-11-26 NOTE — Progress Notes (Signed)
Pt denies any problems or concerns at this time.  

## 2015-11-26 NOTE — Progress Notes (Signed)
Patient ID: Judith King, female   DOB: 1990-09-06, 25 y.o.   MRN: [id number]  G2P1001 3444w2d Estimated Date of Delivery: 04/12/16  Blood pressure 100/62, pulse 74, weight 138 lb (62.596 kg), last menstrual period 06/23/2015, unknown if currently breastfeeding.   refer to the ob flow sheet for FH and FHR, also BP, Wt, Urine results:notable for negative  Patient reports  + good fetal movement, denies any bleeding and no rupture of membranes symptoms or regular contractions. Patient complaints: None. Patient states she plans to use birth control pills in the future.   FHR: 149 bpm  Questions were answered. Assessment: LROB G2P1001 @ 3344w2d    Normal anatomy scan done today.   Plan:  Continued routine obstetrical care,   F/u in 4 weeks for pnx care    By signing my name below, I, Ronney LionSuzanne Le, attest that this documentation has been prepared under the direction and in the presence of Tilda BurrowJohn V Gabbi Whetstone, MD. Electronically Signed: Ronney LionSuzanne Le, ED Scribe. 11/26/2015. 2:06 PM.  I personally performed the services described in this documentation, which was SCRIBED in my presence. The recorded information has been reviewed and considered accurate. It has been edited as necessary during review. Tilda BurrowFERGUSON,Aveline Daus V, MD

## 2015-11-26 NOTE — Progress Notes (Signed)
US 20+2 wks,trans head rt,post pl gr 0,svp of fluid 5.5 cm,normal ov's bilat,cx 3.8 cm,fhr 142 bpm,anatomy complete,no obvious abnormalities seen

## 2015-12-24 ENCOUNTER — Encounter: Payer: Self-pay | Admitting: Obstetrics and Gynecology

## 2015-12-24 ENCOUNTER — Encounter: Payer: No Typology Code available for payment source | Admitting: Obstetrics and Gynecology

## 2016-04-08 DIAGNOSIS — O139 Gestational [pregnancy-induced] hypertension without significant proteinuria, unspecified trimester: Secondary | ICD-10-CM

## 2016-04-08 DIAGNOSIS — D649 Anemia, unspecified: Secondary | ICD-10-CM

## 2016-07-03 ENCOUNTER — Encounter (HOSPITAL_COMMUNITY): Payer: Self-pay

## 2017-10-04 ENCOUNTER — Ambulatory Visit: Payer: Self-pay | Admitting: Adult Health

## 2017-10-05 ENCOUNTER — Encounter (INDEPENDENT_AMBULATORY_CARE_PROVIDER_SITE_OTHER): Payer: Self-pay

## 2017-10-05 ENCOUNTER — Encounter: Payer: Self-pay | Admitting: Adult Health

## 2017-10-05 ENCOUNTER — Ambulatory Visit (INDEPENDENT_AMBULATORY_CARE_PROVIDER_SITE_OTHER): Payer: Self-pay | Admitting: Adult Health

## 2017-10-05 ENCOUNTER — Other Ambulatory Visit: Payer: Self-pay

## 2017-10-05 VITALS — BP 104/60 | HR 74 | Ht 62.0 in | Wt 178.0 lb

## 2017-10-05 DIAGNOSIS — Z3490 Encounter for supervision of normal pregnancy, unspecified, unspecified trimester: Secondary | ICD-10-CM

## 2017-10-05 DIAGNOSIS — Z302 Encounter for sterilization: Secondary | ICD-10-CM | POA: Insufficient documentation

## 2017-10-05 DIAGNOSIS — N926 Irregular menstruation, unspecified: Secondary | ICD-10-CM

## 2017-10-05 DIAGNOSIS — Z3201 Encounter for pregnancy test, result positive: Secondary | ICD-10-CM | POA: Insufficient documentation

## 2017-10-05 DIAGNOSIS — Z363 Encounter for antenatal screening for malformations: Secondary | ICD-10-CM

## 2017-10-05 DIAGNOSIS — O0933 Supervision of pregnancy with insufficient antenatal care, third trimester: Secondary | ICD-10-CM

## 2017-10-05 LAB — POCT URINE PREGNANCY: Preg Test, Ur: POSITIVE — AB

## 2017-10-05 NOTE — Patient Instructions (Signed)

## 2017-10-05 NOTE — Progress Notes (Signed)
Subjective:     Patient ID: Judith LundborgElizabeth King, female   DOB: April 12, 1991, [age] y.o.   MRN: [id number]  HPI Judith Manislizabeth is a 27 year old Hispanic female in for UPT, Judith King has missed several periods, last period not unknown, had +HPT in December and +Fm in December.   Review of Systems +missed periods,+HPT in December +FM since December Reviewed past medical,surgical, social and family history. Reviewed medications and allergies.     Objective:   Physical Exam BP 104/60 (BP Location: Right Arm, Patient Position: Sitting, Cuff Size: Normal)   Pulse 74   Ht 5\' 2"  (1.575 m)   Wt 178 lb (80.7 kg)   LMP  (LMP Unknown)   BMI 32.56 kg/m UPT+,about 30 weeks by FH, +FHR 122, +FM, VTX,+FM,looks like a boy,on bedside US. Skin warm and dry. Neck: mid line trachea, normal thyroid, good ROM, no lymphadenopathy noted. Lungs: clear to ausculation bilaterally. Cardiovascular: regular rate and rhythm. Judith King [facility]     Assessment:     1. Positive pregnancy test   2. Pregnancy, unspecified gestational age   593. Antenatal screening for malformation using ultrasonics   4. Late prenatal care affecting pregnancy in third trimester       Plan:   Take PNV Go apply for pregnancy medicaid  Return 3/8 (her request) for anatomy and dating US Review handouts [facility] third [facility]

## 2017-10-14 ENCOUNTER — Ambulatory Visit (INDEPENDENT_AMBULATORY_CARE_PROVIDER_SITE_OTHER): Payer: Self-pay

## 2017-10-14 DIAGNOSIS — Z363 Encounter for antenatal screening for malformations: Secondary | ICD-10-CM

## 2017-10-14 NOTE — Progress Notes (Signed)
US 36+3 wks,cephalic,posterior pl gr 2,normal ovaries bilat,cx 4.2 cm,afi 15 cm,fhr 137 bpm,anatomy complete,no obvious abnormalities,EFW 2878 g,EDD 11/08/2017 by today's ultrasound

## 2017-10-19 ENCOUNTER — Encounter: Payer: Self-pay | Admitting: Advanced Practice Midwife

## 2017-10-19 ENCOUNTER — Ambulatory Visit (INDEPENDENT_AMBULATORY_CARE_PROVIDER_SITE_OTHER): Payer: Self-pay | Admitting: Advanced Practice Midwife

## 2017-10-19 ENCOUNTER — Ambulatory Visit: Payer: Self-pay | Admitting: *Deleted

## 2017-10-19 VITALS — BP 120/80 | HR 65 | Wt 183.0 lb

## 2017-10-19 DIAGNOSIS — Z3490 Encounter for supervision of normal pregnancy, unspecified, unspecified trimester: Secondary | ICD-10-CM | POA: Insufficient documentation

## 2017-10-19 DIAGNOSIS — Z331 Pregnant state, incidental: Secondary | ICD-10-CM

## 2017-10-19 DIAGNOSIS — O0933 Supervision of pregnancy with insufficient antenatal care, third trimester: Secondary | ICD-10-CM

## 2017-10-19 DIAGNOSIS — Z3A37 37 weeks gestation of pregnancy: Secondary | ICD-10-CM

## 2017-10-19 DIAGNOSIS — Z3483 Encounter for supervision of other normal pregnancy, third trimester: Secondary | ICD-10-CM

## 2017-10-19 DIAGNOSIS — Z1389 Encounter for screening for other disorder: Secondary | ICD-10-CM

## 2017-10-19 DIAGNOSIS — Z131 Encounter for screening for diabetes mellitus: Secondary | ICD-10-CM

## 2017-10-19 LAB — POCT URINALYSIS DIPSTICK
GLUCOSE UA: NEGATIVE
Ketones, UA: NEGATIVE
Leukocytes, UA: NEGATIVE
Nitrite, UA: NEGATIVE
Protein, UA: NEGATIVE
RBC UA: NEGATIVE

## 2017-10-19 LAB — GLUCOSE, POCT (MANUAL RESULT ENTRY): POC Glucose: 87 mg/dl (ref 70–99)

## 2017-10-19 NOTE — Progress Notes (Signed)
Subjective:    Judith King is a [id number] [redacted]w[redacted]d being seen today for her first obstetrical visit.  Her obstetrical history is significant for late [facility].  Says she didn't k now she was pregnant until a month ago. Pregnancy history fully reviewed. Has had 2 CS (FTP and repeat).  Wants Repeat, also wants BTL.  Knows that it wouldn't be covered because it's elective.  Can discuss w/Judith King at next visit to see if there are any self pay options since she's having a repeat CS.  Discussed $100 Judith King here or Judith King at HD Patient reports no complaints.  Vitals:   10/19/17 0925  BP: 120/80  Pulse: 65  Weight: 183 lb (83 kg)    HISTORY: OB History  Gravida Para Term Preterm AB Living  3 2 2  0 0 2  SAB TAB Ectopic Multiple Live Births  0 0 0 0 2    # Outcome Date GA Lbr Len/2nd Weight Sex Delivery Anes PTL Lv  3 Current           2 Term 04/08/16 [redacted]w[redacted]d  8 lb 7 oz (3.827 kg) F CS-LTranv Spinal N LIV     Complications: Gestational hypertension,Anemia  1 Term 01/24/13 [redacted]w[redacted]d  9 lb 9 oz (4.338 kg) F CS-LTranv EPI N LIV     Complications: Chorioamnionitis,Failure to Progress in First Stage     Past Medical History:  Diagnosis Date  . Anemia   . Hypertension   . Supervision of normal pregnancy in first trimester 08/29/2015    [facility] Initiated Care at    7+4 weeks FOB   Judith King 27 yo HM Dating By  [place] Pap  GC/CT Initial:                36+wks: Genetic Screen NT/IT:  CF screen  Anatomic [place]  Flu vaccine  Tdap Recommended ~ 28wks Glucose Screen  2 hr GBS  Feed Preference  Contraception  Circumcision  Childbirth Classes  Pediatrician     Past Surgical History:  Procedure Laterality Date  . CESAREAN SECTION N/A 01/24/2013   Procedure: CESAREAN SECTION;  Surgeon: Catalina Antigua, MD;  Location: [facility];  Service: Obstetrics;  Laterality: N/A;   Family History  Problem Relation Age of Onset  . Diabetes Maternal Grandfather   . Diabetes Other      Exam                                       System:     Skin: normal coloration and turgor, no rashes    Neurologic: oriented, normal, normal mood   Extremities: normal strength, tone, and muscle mass   HEENT PERRLA   Mouth/Teeth mucous membranes moist, normal dentition   Neck supple and no masses   Cardiovascular: regular rate and rhythm   Respiratory:  appears well, vitals normal, no respiratory distress, acyanotic   Abdomen: soft, non-tender;  FHR: 147        The nature of [facility] with multiple MDs and other Advanced Practice Providers was explained to patient; also emphasized that residents, students are part of our team.  Assessment:    Pregnancy: [id number] Patient Active Problem List   Diagnosis Date Noted  . Supervision of normal pregnancy 28/13/2019  . Late prenatal care affecting pregnancy in third trimester 10/05/2017  . Antenatal screening for malformation using ultrasonics 10/05/2017  .  Positive pregnancy test 10/05/2017  . Pregnancy 08/29/2015        Plan:     Initial labs drawn. BS 89 --will defer gtt d/t late GA, planned CS  Problem list reviewed and updated   Return in about 1 week (around 10/26/2017) for LROB w/Judith King, schedule CS, , sign BTL form.  Judith King 10/19/2017

## 2017-10-20 LAB — CBC
HEMATOCRIT: 35.6 % (ref 34.0–46.6)
HEMOGLOBIN: 11.7 g/dL (ref 11.1–15.9)
MCH: 29.2 pg (ref 26.6–33.0)
MCHC: 32.9 g/dL (ref 31.5–35.7)
MCV: 89 fL (ref 79–97)
Platelets: 205 10*3/uL (ref 150–379)
RBC: 4.01 x10E6/uL (ref 3.77–5.28)
RDW: 14.5 % (ref 12.3–15.4)
WBC: 8.4 10*3/uL (ref 3.4–10.8)

## 2017-10-20 LAB — HEPATITIS B SURFACE ANTIGEN: HEP B S AG: NEGATIVE

## 2017-10-20 LAB — HIV ANTIBODY (ROUTINE TESTING W REFLEX): HIV Screen 4th Generation wRfx: NONREACTIVE

## 2017-10-20 LAB — RUBELLA SCREEN: Rubella Antibodies, IGG: 1.18 index (ref >0.99)

## 2017-10-20 LAB — RPR: RPR Ser Ql: NONREACTIVE

## 2017-10-20 LAB — ANTIBODY SCREEN: Antibody Screen: NEGATIVE

## 2017-10-21 LAB — URINE CULTURE

## 2017-10-21 LAB — GC/CHLAMYDIA PROBE AMP
CHLAMYDIA, DNA PROBE: NEGATIVE
NEISSERIA GONORRHOEAE BY PCR: NEGATIVE

## 2017-10-23 LAB — CULTURE, BETA STREP (GROUP B ONLY): STREP GP B CULTURE: NEGATIVE

## 2017-10-24 ENCOUNTER — Encounter: Payer: Self-pay | Admitting: Obstetrics and Gynecology

## 2017-10-24 ENCOUNTER — Ambulatory Visit (INDEPENDENT_AMBULATORY_CARE_PROVIDER_SITE_OTHER): Payer: Self-pay | Admitting: Obstetrics and Gynecology

## 2017-10-24 ENCOUNTER — Other Ambulatory Visit: Payer: Self-pay

## 2017-10-24 VITALS — BP 128/80 | HR 63 | Wt 184.8 lb

## 2017-10-24 DIAGNOSIS — Z3A37 37 weeks gestation of pregnancy: Secondary | ICD-10-CM

## 2017-10-24 DIAGNOSIS — O34219 Maternal care for unspecified type scar from previous cesarean delivery: Secondary | ICD-10-CM

## 2017-10-24 DIAGNOSIS — Z1389 Encounter for screening for other disorder: Secondary | ICD-10-CM

## 2017-10-24 DIAGNOSIS — Z331 Pregnant state, incidental: Secondary | ICD-10-CM

## 2017-10-24 DIAGNOSIS — Z3483 Encounter for supervision of other normal pregnancy, third trimester: Secondary | ICD-10-CM

## 2017-10-24 LAB — POCT URINALYSIS DIPSTICK
Blood, UA: NEGATIVE
GLUCOSE UA: NEGATIVE
Ketones, UA: NEGATIVE
Leukocytes, UA: NEGATIVE
Nitrite, UA: NEGATIVE
Protein, UA: NEGATIVE

## 2017-10-24 NOTE — Progress Notes (Signed)
LOW-RISK PREGNANCY VISIT Patient name: Judith King MRN [id number]  Date of birth: 06-28-1991 Chief Complaint: Low risk OB, prior cesarean x2 desires repeat cesarean and tubal ligation due to late ultrasound dating we will schedule for 39 weeks 3 days  Routine Prenatal Visit Previous cesarean section x2 h dating by third trimester ultrasound 3 weeks  History of Present Illness:   Judith King is a 27 y.o. 723P2002 female at 9164w6d with an Estimated Date of Delivery: 11/08/17 being seen today for ongoing management of a low-risk pregnancy.  Today she reports no complaints. Contractions: Not present. Vag. Bleeding: None.  Movement: Present. denies leaking of fluid. Review of Systems:   Pertinent items are noted in HPI Denies abnormal vaginal discharge w/ itching/odor/irritation, headaches, visual changes, shortness of breath, chest pain, abdominal pain, severe nausea/vomiting, or problems with urination or bowel movements unless otherwise stated above. Patient confirmed through translator that she desires permanent sterilization having had 2 prior cesarean sections this will be her third she understands the permanency of the requested procedure and desires to sign papers today I have checked with the coding authorities and it appears that since the patient will not be eligible for pregnancy Medicaid, she will qualify for emergency Medicaid Upon arrival to the hospital. I was advised that she can go ahead and request tubal sterilization and we can perform the sterilization at the time of the procedure though reimbursements not likely to be forthcoming. The patient is here with her translator Pertinent History Reviewed:  Reviewed past medical,surgical, social, obstetrical and family history.  Reviewed problem list, medications and allergies. Physical Assessment:   Vitals:   10/24/17 0853  BP: 128/80  Pulse: 63  Weight: 184 lb 12.8 oz (83.8 kg)  Body mass index is 33.8  kg/m.        Physical Examination:   General appearance: Well appearing, and in no distress  Mental status: Alert, oriented to person, place, and time  Skin: Warm & dry  Cardiovascular: Normal heart rate noted  Respiratory: Normal respiratory effort, no distress  Abdomen: Soft, gravid, nontender  Pelvic: Cervical exam deferred         Extremities: Edema: Trace  Fetal Status:     Movement: Present    Results for orders placed or performed in visit on 10/24/17 (from the past 24 hour(s))  POCT urinalysis dipstick   Collection Time: 10/24/17  8:54 AM  Result Value Ref Range   Color, UA     Clarity, UA     Glucose, UA neg    Bilirubin, UA     Ketones, UA neg    Spec Grav, UA  1.010 - 1.025   Blood, UA neg    pH, UA  5.0 - 8.0   Protein, UA neg    Urobilinogen, UA  0.2 or 1.0 E.U./dL   Nitrite, UA neg    Leukocytes, UA Negative Negative   Appearance     Odor      Assessment & Plan:  1) Low-risk pregnancy G3P2002 at 7064w6d with an Estimated Date of Delivery: 11/08/17 late prenatal care 2) prior cesarean x2 desiring repeat cesarean scheduled for Friday, March 29 at 2:30 PM, desire for elective permanent sterilization   Meds: No orders of the defined types were placed in this encounter.  Labs/procedures today: None  Plan:  Continue routine obstetrical care scheduled repeat cesarean case [id number]  Reviewed: Term labor symptoms and general obstetric precautions including but not limited to vaginal bleeding, contractions, leaking of fluid  and fetal movement were reviewed in detail with the patient.  All questions were answered patient will be seen back next week for finalization of preoperative plans Follow-up: Return in about 1 week (around 10/31/2017) for LROB, pre-op visit.  Orders Placed This Encounter  Procedures  . POCT urinalysis dipstick   Tilda Burrow CNM, [facility] 10/24/2017 10:44 AM

## 2017-10-25 ENCOUNTER — Encounter (HOSPITAL_COMMUNITY): Payer: Self-pay

## 2017-10-25 NOTE — Pre-Procedure Instructions (Signed)
[id number] interpreter number

## 2017-11-02 ENCOUNTER — Encounter: Payer: Self-pay | Admitting: Obstetrics and Gynecology

## 2017-11-02 NOTE — Patient Instructions (Signed)
Instrucciones Para Antes de la Ciruga   Su ciruga est programada para 11/04/2017  (your procedure is scheduled on) Entre por la entrada principal del [facility]  a las [address] de [address] [place] -(enter through the main entrance at [facility]  [facility]    Levante el telfono, [place] el [phone] para informarnos de su llegada. (pick up phone, dial [phone] on arrival)     Por favor llame al [phone] si tiene algn problema en la maana de la ciruga (please call this number if you have any problems the morning of surgery.)                  Recuerde: (Remember)  No coma alimentos. (Do not eat food (After Midnight) Desps de medianoche)    No tome lquidos claros. (Do not drink clear liquids (6 Hours before arrival) 6 horas ante llegada)    No use joyas, maquillaje de ojos, lpiz labial, crema para el cuerpo o esmalte de uas oscuro. (Do not wear jewelry, eye makeup, lipstick, body lotion, or dark fingernail polish). Puede usar desodorante (you may wear deodorant)    No se afeite 48 horas de su ciruga. (Do not shave 48 hours before your surgery)    No traiga objetos de valor al hospital.  [facility] no se hace responsable de ninguna pertenencia, ni objetos de valor que haya trado al hospital. (Do not bring valuable to the hospital.  [facility] is not responsible for any belongings or valuables brought to the hospital)   [facility] estas medicinas en la maana de la ciruga con un SORBITO de agua nada (take these meds the morning of surgery with a SIP of water)     Durante la ciruga no se pueden usar lentes de contacto, dentaduras o puentes. (Contacts, dentures or bridgework cannot be worn in surgery).   Si va a ser ingresado despus de la ciruga, deje la AMR Corporationmaleta en el carro hasta que se le haya asignado una habitacin. (If you are to be admitted after surgery, leave suitcase in car until your room has been assigned.)   A los pacientes que se les d de  alta el mismo da no se les permitir manejar a casa.  (Patients discharged on the day of surgery will not be allowed to drive home)    [place] y nmero de telfono del Programmer, multimediaconductor na. (Name and telephone number of your driver)   Instrucciones especiales N/A (Special Instructions)   Por favor, lea las hojas informativas que le entregaron. (Please read over the following fact sheets that you were given) Surgical Site Infection Prevention

## 2017-11-03 ENCOUNTER — Encounter (HOSPITAL_COMMUNITY)
Admission: RE | Admit: 2017-11-03 | Discharge: 2017-11-03 | Disposition: A | Discharge status: Home / Self Care | Payer: Medicaid Other | Source: Ambulatory Visit | Attending: Obstetrics and Gynecology | Admitting: Obstetrics and Gynecology

## 2017-11-03 LAB — CBC
HEMATOCRIT: 34.5 % — AB (ref 36.0–46.0)
HEMOGLOBIN: 11.4 g/dL — AB (ref 12.0–15.0)
MCH: 28.7 pg (ref 26.0–34.0)
MCHC: 33 g/dL (ref 30.0–36.0)
MCV: 86.9 fL (ref 78.0–100.0)
Platelets: 194 10*3/uL (ref 150–400)
RBC: 3.97 MIL/uL (ref 3.87–5.11)
RDW: 14.5 % (ref 11.5–15.5)
WBC: 8.5 10*3/uL (ref 4.0–10.5)

## 2017-11-03 LAB — TYPE AND SCREEN
ABO/RH(D): O POS
Antibody Screen: NEGATIVE

## 2017-11-04 ENCOUNTER — Inpatient Hospital Stay (HOSPITAL_COMMUNITY)
Admission: AD | Admit: 2017-11-04 | Discharge: 2017-11-07 | DRG: 785 | Disposition: A | Discharge status: Home / Self Care | Payer: Medicaid Other | Source: Ambulatory Visit | Attending: Obstetrics and Gynecology | Admitting: Obstetrics and Gynecology

## 2017-11-04 ENCOUNTER — Inpatient Hospital Stay (HOSPITAL_COMMUNITY): Payer: Medicaid Other | Admitting: Anesthesiology

## 2017-11-04 ENCOUNTER — Encounter (HOSPITAL_COMMUNITY): Payer: Self-pay | Admitting: *Deleted

## 2017-11-04 ENCOUNTER — Encounter (HOSPITAL_COMMUNITY)
Admission: AD | Disposition: A | Discharge status: Home / Self Care | Payer: Self-pay | Source: Ambulatory Visit | Attending: Obstetrics and Gynecology

## 2017-11-04 DIAGNOSIS — Z98891 History of uterine scar from previous surgery: Secondary | ICD-10-CM

## 2017-11-04 DIAGNOSIS — Z302 Encounter for sterilization: Secondary | ICD-10-CM

## 2017-11-04 DIAGNOSIS — O34211 Maternal care for low transverse scar from previous cesarean delivery: Secondary | ICD-10-CM | POA: Diagnosis present

## 2017-11-04 DIAGNOSIS — Z9851 Tubal ligation status: Secondary | ICD-10-CM

## 2017-11-04 DIAGNOSIS — O9081 Anemia of the puerperium: Secondary | ICD-10-CM | POA: Diagnosis not present

## 2017-11-04 DIAGNOSIS — Z3A39 39 weeks gestation of pregnancy: Secondary | ICD-10-CM

## 2017-11-04 DIAGNOSIS — D649 Anemia, unspecified: Secondary | ICD-10-CM | POA: Diagnosis not present

## 2017-11-04 LAB — CBC
HEMATOCRIT: 34.4 % — AB (ref 36.0–46.0)
HEMOGLOBIN: 11.4 g/dL — AB (ref 12.0–15.0)
MCH: 28.7 pg (ref 26.0–34.0)
MCHC: 33.1 g/dL (ref 30.0–36.0)
MCV: 86.6 fL (ref 78.0–100.0)
Platelets: 179 10*3/uL (ref 150–400)
RBC: 3.97 MIL/uL (ref 3.87–5.11)
RDW: 14.5 % (ref 11.5–15.5)
WBC: 18.1 10*3/uL — ABNORMAL HIGH (ref 4.0–10.5)

## 2017-11-04 LAB — RPR: RPR: NONREACTIVE

## 2017-11-04 LAB — CREATININE, SERUM
Creatinine, Ser: 0.45 mg/dL (ref 0.44–1.00)
GFR calc Af Amer: >60 mL/min (ref >60)

## 2017-11-04 SURGERY — Surgical Case
Anesthesia: Spinal | Site: Abdomen | Laterality: Bilateral | Wound class: Clean Contaminated

## 2017-11-04 MED ORDER — NALBUPHINE HCL 10 MG/ML IJ SOLN: 5.0000 mg | INTRAMUSCULAR | Status: DC | PRN

## 2017-11-04 MED ORDER — BUPIVACAINE IN DEXTROSE 0.75-8.25 % IT SOLN
INTRATHECAL | Status: DC | PRN
  Administered 2017-11-04: 10.5 mg via INTRATHECAL

## 2017-11-04 MED ORDER — SENNOSIDES-DOCUSATE SODIUM 8.6-50 MG PO TABS
2.0000 | ORAL_TABLET | ORAL | Status: DC
  Administered 2017-11-05 – 2017-11-06 (×3): 2 via ORAL
  Filled 2017-11-04 (×3): qty 2

## 2017-11-04 MED ORDER — LACTATED RINGERS IV SOLN
INTRAVENOUS | Status: DC
  Administered 2017-11-04 (×2): via INTRAVENOUS

## 2017-11-04 MED ORDER — CEFAZOLIN SODIUM-DEXTROSE 2-4 GM/100ML-% IV SOLN
2.0000 g | INTRAVENOUS | Status: AC
  Administered 2017-11-04: 2 g via INTRAVENOUS

## 2017-11-04 MED ORDER — DIBUCAINE 1 % RE OINT: 1.0000 "application " | TOPICAL_OINTMENT | RECTAL | Status: DC | PRN

## 2017-11-04 MED ORDER — SCOPOLAMINE 1 MG/3DAYS TD PT72
MEDICATED_PATCH | TRANSDERMAL | Status: AC
  Filled 2017-11-04: qty 1

## 2017-11-04 MED ORDER — SODIUM CHLORIDE 0.9% FLUSH: 3.0000 mL | INTRAVENOUS | Status: DC | PRN

## 2017-11-04 MED ORDER — METOCLOPRAMIDE HCL 5 MG/ML IJ SOLN: 10.0000 mg | Freq: Once | INTRAMUSCULAR | Status: DC | PRN

## 2017-11-04 MED ORDER — PHENYLEPHRINE 8 MG IN D5W 100 ML (0.08MG/ML) PREMIX OPTIME
INJECTION | INTRAVENOUS | Status: AC
  Filled 2017-11-04: qty 100

## 2017-11-04 MED ORDER — WITCH HAZEL-GLYCERIN EX PADS: 1.0000 "application " | MEDICATED_PAD | CUTANEOUS | Status: DC | PRN

## 2017-11-04 MED ORDER — FENTANYL CITRATE (PF) 100 MCG/2ML IJ SOLN: 25.0000 ug | INTRAMUSCULAR | Status: DC | PRN

## 2017-11-04 MED ORDER — ONDANSETRON HCL 4 MG/2ML IJ SOLN
INTRAMUSCULAR | Status: AC
  Filled 2017-11-04: qty 2

## 2017-11-04 MED ORDER — NALBUPHINE HCL 10 MG/ML IJ SOLN: 5.0000 mg | Freq: Once | INTRAMUSCULAR | Status: DC | PRN

## 2017-11-04 MED ORDER — LACTATED RINGERS IV SOLN
INTRAVENOUS | Status: DC
  Administered 2017-11-05: 03:00:00 via INTRAVENOUS

## 2017-11-04 MED ORDER — FENTANYL CITRATE (PF) 100 MCG/2ML IJ SOLN
INTRAMUSCULAR | Status: AC
  Filled 2017-11-04: qty 2

## 2017-11-04 MED ORDER — SIMETHICONE 80 MG PO CHEW
80.0000 mg | CHEWABLE_TABLET | ORAL | Status: DC
  Administered 2017-11-05 – 2017-11-06 (×3): 80 mg via ORAL
  Filled 2017-11-04 (×3): qty 1

## 2017-11-04 MED ORDER — ENOXAPARIN SODIUM 40 MG/0.4ML ~~LOC~~ SOLN
40.0000 mg | SUBCUTANEOUS | Status: DC
  Administered 2017-11-07: 40 mg via SUBCUTANEOUS
  Filled 2017-11-04 (×4): qty 0.4

## 2017-11-04 MED ORDER — ACETAMINOPHEN 325 MG PO TABS
650.0000 mg | ORAL_TABLET | ORAL | Status: DC | PRN
  Administered 2017-11-05 – 2017-11-06 (×3): 650 mg via ORAL
  Filled 2017-11-04 (×3): qty 2

## 2017-11-04 MED ORDER — PHENYLEPHRINE 8 MG IN D5W 100 ML (0.08MG/ML) PREMIX OPTIME
INJECTION | INTRAVENOUS | Status: DC | PRN
  Administered 2017-11-04: 60 ug/min via INTRAVENOUS

## 2017-11-04 MED ORDER — PROMETHAZINE HCL 25 MG/ML IJ SOLN
6.2500 mg | Freq: Four times a day (QID) | INTRAMUSCULAR | Status: DC | PRN
  Administered 2017-11-04: 6.25 mg via INTRAVENOUS

## 2017-11-04 MED ORDER — ACETAMINOPHEN 500 MG PO TABS
1000.0000 mg | ORAL_TABLET | Freq: Four times a day (QID) | ORAL | Status: AC
  Administered 2017-11-05 (×2): 1000 mg via ORAL
  Filled 2017-11-04 (×2): qty 2

## 2017-11-04 MED ORDER — STERILE WATER FOR IRRIGATION IR SOLN
Status: DC | PRN
  Administered 2017-11-04: 1000 mL

## 2017-11-04 MED ORDER — COCONUT OIL OIL: 1.0000 "application " | TOPICAL_OIL | Status: DC | PRN

## 2017-11-04 MED ORDER — SIMETHICONE 80 MG PO CHEW
80.0000 mg | CHEWABLE_TABLET | Freq: Three times a day (TID) | ORAL | Status: DC
  Administered 2017-11-05 – 2017-11-07 (×6): 80 mg via ORAL
  Filled 2017-11-04 (×8): qty 1

## 2017-11-04 MED ORDER — KETOROLAC TROMETHAMINE 30 MG/ML IJ SOLN
30.0000 mg | Freq: Four times a day (QID) | INTRAMUSCULAR | Status: AC | PRN
  Administered 2017-11-04: 30 mg via INTRAVENOUS

## 2017-11-04 MED ORDER — MORPHINE SULFATE (PF) 0.5 MG/ML IJ SOLN
INTRAMUSCULAR | Status: DC | PRN
  Administered 2017-11-04: .2 mg via INTRATHECAL

## 2017-11-04 MED ORDER — MORPHINE SULFATE (PF) 0.5 MG/ML IJ SOLN
INTRAMUSCULAR | Status: AC
  Filled 2017-11-04: qty 10

## 2017-11-04 MED ORDER — SCOPOLAMINE 1 MG/3DAYS TD PT72
1.0000 | MEDICATED_PATCH | Freq: Once | TRANSDERMAL | Status: AC
  Administered 2017-11-04: 1.5 mg via TRANSDERMAL

## 2017-11-04 MED ORDER — OXYTOCIN 10 UNIT/ML IJ SOLN
INTRAMUSCULAR | Status: AC
  Filled 2017-11-04: qty 4

## 2017-11-04 MED ORDER — OXYTOCIN 40 UNITS IN LACTATED RINGERS INFUSION - SIMPLE MED: 2.5000 [IU]/h | INTRAVENOUS | Status: AC

## 2017-11-04 MED ORDER — OXYTOCIN 10 UNIT/ML IJ SOLN
INTRAMUSCULAR | Status: DC | PRN
Start: 2017-11-04 — End: 2017-11-04
  Administered 2017-11-04: 40 [IU] via INTRAVENOUS

## 2017-11-04 MED ORDER — TETANUS-DIPHTH-ACELL PERTUSSIS 5-2.5-18.5 LF-MCG/0.5 IM SUSP
0.5000 mL | Freq: Once | INTRAMUSCULAR | Status: AC
  Administered 2017-11-06: 0.5 mL via INTRAMUSCULAR
  Filled 2017-11-04: qty 0.5

## 2017-11-04 MED ORDER — DIPHENHYDRAMINE HCL 50 MG/ML IJ SOLN: 12.5000 mg | INTRAMUSCULAR | Status: DC | PRN

## 2017-11-04 MED ORDER — SIMETHICONE 80 MG PO CHEW: 80.0000 mg | CHEWABLE_TABLET | ORAL | Status: DC | PRN

## 2017-11-04 MED ORDER — KETOROLAC TROMETHAMINE 30 MG/ML IJ SOLN
INTRAMUSCULAR | Status: AC
  Filled 2017-11-04: qty 1

## 2017-11-04 MED ORDER — DIPHENHYDRAMINE HCL 25 MG PO CAPS: 25.0000 mg | ORAL_CAPSULE | ORAL | Status: DC | PRN

## 2017-11-04 MED ORDER — SOD CITRATE-CITRIC ACID 500-334 MG/5ML PO SOLN
30.0000 mL | ORAL | Status: AC
  Administered 2017-11-04: 30 mL via ORAL
  Filled 2017-11-04: qty 15

## 2017-11-04 MED ORDER — MENTHOL 3 MG MT LOZG: 1.0000 | LOZENGE | OROMUCOSAL | Status: DC | PRN

## 2017-11-04 MED ORDER — IBUPROFEN 600 MG PO TABS
600.0000 mg | ORAL_TABLET | Freq: Four times a day (QID) | ORAL | Status: DC
  Administered 2017-11-05 – 2017-11-07 (×12): 600 mg via ORAL
  Filled 2017-11-04 (×13): qty 1

## 2017-11-04 MED ORDER — DIPHENHYDRAMINE HCL 25 MG PO CAPS: 25.0000 mg | ORAL_CAPSULE | Freq: Four times a day (QID) | ORAL | Status: DC | PRN

## 2017-11-04 MED ORDER — NALOXONE HCL 0.4 MG/ML IJ SOLN: 0.4000 mg | INTRAMUSCULAR | Status: DC | PRN

## 2017-11-04 MED ORDER — NALOXONE HCL 4 MG/10ML IJ SOLN: 1.0000 ug/kg/h | INTRAVENOUS | Status: DC | PRN

## 2017-11-04 MED ORDER — KETOROLAC TROMETHAMINE 30 MG/ML IJ SOLN: 30.0000 mg | Freq: Four times a day (QID) | INTRAMUSCULAR | Status: AC | PRN

## 2017-11-04 MED ORDER — PRENATAL MULTIVITAMIN CH
1.0000 | ORAL_TABLET | Freq: Every day | ORAL | Status: DC
  Administered 2017-11-06 – 2017-11-07 (×2): 1 via ORAL
  Filled 2017-11-04 (×4): qty 1

## 2017-11-04 MED ORDER — ONDANSETRON HCL 4 MG/2ML IJ SOLN
INTRAMUSCULAR | Status: DC | PRN
  Administered 2017-11-04: 4 mg via INTRAVENOUS

## 2017-11-04 MED ORDER — SODIUM CHLORIDE 0.9 % IR SOLN
Status: DC | PRN
  Administered 2017-11-04: 1000 mL

## 2017-11-04 MED ORDER — ONDANSETRON HCL 4 MG/2ML IJ SOLN
4.0000 mg | Freq: Three times a day (TID) | INTRAMUSCULAR | Status: DC | PRN
  Administered 2017-11-04: 4 mg via INTRAVENOUS

## 2017-11-04 MED ORDER — ZOLPIDEM TARTRATE 5 MG PO TABS: 5.0000 mg | ORAL_TABLET | Freq: Every evening | ORAL | Status: DC | PRN

## 2017-11-04 MED ORDER — MEPERIDINE HCL 25 MG/ML IJ SOLN: 6.2500 mg | INTRAMUSCULAR | Status: DC | PRN

## 2017-11-04 MED ORDER — PROMETHAZINE HCL 25 MG/ML IJ SOLN
INTRAMUSCULAR | Status: AC
  Filled 2017-11-04: qty 1

## 2017-11-04 MED ORDER — LACTATED RINGERS IV SOLN
INTRAVENOUS | Status: DC
  Administered 2017-11-04: 19:00:00 via INTRAVENOUS

## 2017-11-04 MED ORDER — FENTANYL CITRATE (PF) 100 MCG/2ML IJ SOLN
INTRAMUSCULAR | Status: DC | PRN
  Administered 2017-11-04: 10 ug via INTRATHECAL

## 2017-11-04 SURGICAL SUPPLY — 33 items
BENZOIN TINCTURE PRP APPL 2/3 (GAUZE/BANDAGES/DRESSINGS) ×3 IMPLANT
CLAMP CORD UMBIL (MISCELLANEOUS) ×3 IMPLANT
CLIP FILSHIE TUBAL LIGA STRL (Clip) ×3 IMPLANT
CLOSURE WOUND 1/2 X4 (GAUZE/BANDAGES/DRESSINGS) ×1
CLOTH BEACON ORANGE TIMEOUT ST (SAFETY) ×3 IMPLANT
DRSG OPSITE POSTOP 4X10 (GAUZE/BANDAGES/DRESSINGS) ×3 IMPLANT
DURAPREP 26ML APPLICATOR (WOUND CARE) ×3 IMPLANT
ELECT REM PT RETURN 9FT ADLT (ELECTROSURGICAL) ×3
ELECTRODE REM PT RTRN 9FT ADLT (ELECTROSURGICAL) ×1 IMPLANT
GLOVE BIO SURGEON ST LM GN SZ9 (GLOVE) ×3 IMPLANT
GLOVE BIOGEL PI IND STRL 7.0 (GLOVE) ×1 IMPLANT
GLOVE BIOGEL PI IND STRL 9 (GLOVE) ×1 IMPLANT
GLOVE BIOGEL PI INDICATOR 7.0 (GLOVE) ×2
GLOVE BIOGEL PI INDICATOR 9 (GLOVE) ×2
GOWN STRL REUS W/TWL 2XL LVL3 (GOWN DISPOSABLE) ×3 IMPLANT
GOWN STRL REUS W/TWL LRG LVL3 (GOWN DISPOSABLE) ×6 IMPLANT
NS IRRIG 1000ML POUR BTL (IV SOLUTION) ×3 IMPLANT
PACK C SECTION WH (CUSTOM PROCEDURE TRAY) ×3 IMPLANT
PAD OB MATERNITY 4.3X12.25 (PERSONAL CARE ITEMS) ×3 IMPLANT
PENCIL SMOKE EVAC W/HOLSTER (ELECTROSURGICAL) ×3 IMPLANT
RTRCTR C-SECT PINK 25CM LRG (MISCELLANEOUS) ×3 IMPLANT
STRIP CLOSURE SKIN 1/2X4 (GAUZE/BANDAGES/DRESSINGS) ×2 IMPLANT
SUT MNCRL 0 VIOLET CTX 36 (SUTURE) ×2 IMPLANT
SUT MONOCRYL 0 CTX 36 (SUTURE) ×4
SUT PLAIN 2 0 (SUTURE) ×2
SUT PLAIN ABS 2-0 CT1 27XMFL (SUTURE) ×1 IMPLANT
SUT VIC AB 0 CT1 27 (SUTURE) ×2
SUT VIC AB 0 CT1 27XBRD ANBCTR (SUTURE) ×1 IMPLANT
SUT VIC AB 2-0 CT1 27 (SUTURE) ×6
SUT VIC AB 2-0 CT1 TAPERPNT 27 (SUTURE) ×3 IMPLANT
SUT VIC AB 4-0 KS 27 (SUTURE) ×3 IMPLANT
TOWEL OR 17X24 6PK STRL BLUE (TOWEL DISPOSABLE) ×3 IMPLANT
TRAY FOLEY BAG SILVER LF 14FR (SET/KITS/TRAYS/PACK) ×3 IMPLANT

## 2017-11-04 NOTE — Op Note (Signed)
Please see the brief operative note for surgical details 

## 2017-11-04 NOTE — Progress Notes (Signed)
Elna Breslowarol Hernandez, interpreter services at beside in PACU C[place], RNC

## 2017-11-04 NOTE — Anesthesia Preprocedure Evaluation (Signed)
Anesthesia Evaluation  Patient identified by MRN, date of birth, ID band Patient awake    Reviewed: Allergy & Precautions, NPO status , Patient's Chart, lab work & pertinent test results  Airway Mallampati: II  TM Distance: >3 FB Neck ROM: Full    Dental no notable dental hx.    Pulmonary neg pulmonary ROS,    Pulmonary exam normal breath sounds clear to auscultation       Cardiovascular hypertension, negative cardio ROS Normal cardiovascular exam Rhythm:Regular Rate:Normal     Neuro/Psych negative neurological ROS  negative psych ROS   GI/Hepatic negative GI ROS, Neg liver ROS,   Endo/Other  negative endocrine ROS  Renal/GU negative Renal ROS  negative genitourinary   Musculoskeletal negative musculoskeletal ROS (+)   Abdominal   Peds negative pediatric ROS (+)  Hematology negative hematology ROS (+)   Anesthesia Other Findings   Reproductive/Obstetrics (+) Pregnancy                             Anesthesia Physical Anesthesia Plan  ASA: II  Anesthesia Plan: Spinal   Post-op Pain Management:    Induction:   PONV Risk Score and Plan: 2 and Treatment may vary due to age or medical condition and Ondansetron  Airway Management Planned: Natural Airway  Additional Equipment:   Intra-op Plan:   Post-operative Plan:   Informed Consent: I have reviewed the patients History and Physical, chart, labs and discussed the procedure including the risks, benefits and alternatives for the proposed anesthesia with the patient or authorized representative who has indicated his/her understanding and acceptance.   Dental advisory given  Plan Discussed with:   Anesthesia Plan Comments:         Anesthesia Quick Evaluation

## 2017-11-04 NOTE — Op Note (Signed)
Alexis wound retractor positioned inside the abdomen3/29/2019  4:01 PM  PATIENT:  Judith King  27 y.o. 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  These were released by transection during the cesarean section procedure SURGEON:  Surgeon(s) and Role:    * Tilda Burrow, MD - Primary  PHYSICIAN ASSISTANT:   ASSISTANTS: none   ANESTHESIA:   none  EBL:  475 mL   BLOOD ADMINISTERED:none  DRAINS: Urinary Catheter (Foley)   LOCAL MEDICATIONS USED:  NONE  SPECIMEN:  Source of Specimen:  Placenta to labor and delivery  DISPOSITION OF SPECIMEN:  PATHOLOGY  COUNTS:  YES  TOURNIQUET:  * No tourniquets in log *  DICTATION: .Dragon Dictation  PLAN OF CARE: Admit to inpatient   PATIENT DISPOSITION:  PACU - hemodynamically stable.   Delay start of Pharmacological VTE agent (>24hrs) due to surgical blood loss or risk of bleeding: not applicable Details of procedure: Patient was taken the operating room prepped and draped for lower abdominal surgery.  The old transverse lower abdominal incision was marked for excision, a 15 cm wide by 5 cm ellipse of skin and fibrotic tissue.  After anesthesia confirmed, timeout conducted abdomen prepped and draped and procedure confirmed by surgical team,, Ancef administered, and we then proceeded with excision of the old cicatrix sharply, then entered the abdomen in a standard midline fashion.  The  Alexis wound retractor was positioned in the lower uterine segment easily visualized.  Transverse uterine incision was made over the vertex revealing clear amniotic fluid without malodor.  Incision was opened by cephalad and caudad manual traction allowing delivery of the fetal vertex with fundal pressure.  The shoulders were easily slipped through the incision manually before delivering the fetal body.  There was no extension into the uterine vessels.  Bleeding was quite low At 1 minute the cord was clamped and the baby passed to waiting pediatrician.  Uterus was massaged and the placenta expelled with good uterine tone and minimal bleeding.  With the uterus contracted you could identify 3 adhesions to the anterior abdominal wall from the fundus portion of the uterus.  These were 1 cm in diameter 2-3 cm in length 2 of which were in the midline and one little bit to the right of the midline, the upper 2 were above the level of the uterine round ligament insertions these were transected with Bovie cautery with good hemostasis. Attention was then directed to closure of the uterus.  It was first irrigated and swabbed out, then closed in a continuous running 0 Monocryl closure with 2 layer closure.  Tissue hemostasis was quite good and tissue reapproximation good. The tubal ligation followed. Tubal ligation: Fallopian tube can be identified in sequence, elevated and a Filshie clip applied to the midportion of the tube with proper positioning confirmed visually The adhesions were then reinspected.  They were sewn closed by placing a 2-0 Vicryl suture at the tip of the adhesion tip.  In a vertical mattress fashion, burying the knot This resulted in good peritoneal surface  coverage of the tip of the adhesion .  The anterior peritoneum was then closed in a running 2-0 Vicryl, the fascia closed transversely and running 0 Vicryl.  Subcutaneous tissues were inspected and made hemostatic with point cautery as necessary,  then the tissue edges pulled together with a series of 5 horizontal mattress sutures of 2 oh plain, with good tissue edge approximation.  Subcuticular 4-0 Vicryl completed the procedure closure.  Sponge and needle counts were correct EBL 475 cc

## 2017-11-04 NOTE — Transfer of Care (Signed)
Immediate Anesthesia Transfer of Care Note  Patient: Judith LundborgElizabeth Moreno-Martinez  Procedure(s) Performed: REPEAT CESAREAN SECTION WITH BILATERAL TUBAL LIGATION (Bilateral Abdomen)  Patient Location: PACU  Anesthesia Type:Spinal  Level of Consciousness: awake  Airway & Oxygen Therapy: Patient Spontanous Breathing  Post-op Assessment: Report given to RN  Post vital signs: Reviewed and stable  Last Vitals:  Vitals Value Taken Time  BP    Temp    Pulse    Resp    SpO2      Last Pain:  Vitals:   11/04/17 1301  TempSrc: Oral         Complications: No apparent anesthesia complications

## 2017-11-04 NOTE — Anesthesia Procedure Notes (Signed)
Spinal  Patient location during procedure: OR Staffing Anesthesiologist: Angle Dirusso, MD Performed: anesthesiologist  Preanesthetic Checklist Completed: patient identified, site marked, surgical consent, pre-op evaluation, timeout performed, IV checked, risks and benefits discussed and monitors and equipment checked Spinal Block Patient position: sitting Prep: DuraPrep Patient monitoring: heart rate, continuous pulse ox and blood pressure Approach: right paramedian Location: L3-4 Injection technique: single-shot Needle Needle type: Sprotte  Needle gauge: 24 G Needle length: 9 cm Additional Notes Expiration date of kit checked and confirmed. Patient tolerated procedure well, without complications.       

## 2017-11-04 NOTE — H&P (Signed)
Judith King is a 27 y.o. female presenting for repeat cesarean section and tubal ligation. She has been followed for prenatal care at [facility] after late onset prenatal care. History significant for 2 prior cesarean sections, and pt desires repeat c/s and permanent sterilization which will be done by filshie clip placement. OB History    Gravida  3   Para  2   Term  2   Preterm  0   AB  0   Living  2     SAB  0   TAB  0   Ectopic  0   Multiple  0   Live Births  2          Past Medical History:  Diagnosis Date  . Anemia   . Hypertension   . Supervision of normal pregnancy in first trimester 08/29/2015    [facility] Initiated Care at    7+4 weeks FOB   [name] 27 yo HM Dating By  [place] Pap  GC/CT Initial:                36+wks: Genetic Screen NT/IT:  CF screen  Anatomic [place]  Flu vaccine  Tdap Recommended ~ 28wks Glucose Screen  2 hr GBS  Feed Preference  Contraception  Circumcision  Childbirth Classes  Pediatrician     Past Surgical History:  Procedure Laterality Date  . CESAREAN SECTION N/A 01/24/2013   Procedure: CESAREAN SECTION;  Surgeon: Catalina Antigua, MD;  Location: [facility];  Service: Obstetrics;  Laterality: N/A;   Family History: family history includes Diabetes in her maternal grandfather and other. Social History:  reports that she has never smoked. She has never used smokeless tobacco. She reports that she does not drink alcohol or use drugs.     Maternal Diabetes: No Genetic Screening: too late Maternal Ultrasounds/Referrals: Normal Fetal Ultrasounds or other Referrals:  None Maternal Substance Abuse:  No Significant Maternal Medications:  None Significant Maternal Lab Results:  None Other Comments:  None  THIRD TRIMESTER SONOGRAM/DATING  Judith King is in the office for third trimester sonogram.  She is a 27 y.o. year old G10P2002 with Estimated Date of Delivery: 11/08/2017 by TODAY'S [place] now at  36+[redacted] weeks  gestation. Thus far the pregnancy has been complicated by [facility].   GESTATION:SINGLETON  PRESENTATION: cephalic  FETAL ACTIVITY:          Heart rate         137          The fetus is active.  AMNIOTIC FLUID: The amniotic fluid volume is  normal, SDP : 158 cm.  PLACENTA LOCALIZATION:  posterior GRADE 2  CERVIX: Measures 4.2 cm  ADNEXA: The ovaries are normal.   GESTATIONAL AGE AND  BIOMETRICS:  Gestational criteria: Estimated Date of Delivery: 11/08/2017 by TODAY'S [place] now at 36+3 WKS  Previous Scans:0              BIPARIETAL DIAMETER           8.81 cm         35+4 weeks  HEAD CIRCUMFERENCE           32.78 cm         37+1 weeks  ABDOMINAL CIRCUMFERENCE           32.26 cm         36+1 weeks  FEMUR LENGTH           7.04 cm  36 weeks                                                           AVERAGE EGA(BY THIS SCAN):  36+3 weeks                                                 ESTIMATED FETAL WEIGHT:       2878  grams     ANATOMICAL SURVEY                                                                            COMMENTS CEREBRAL VENTRICLES yes normal   CHOROID PLEXUS yes normal   CEREBELLUM yes normal   CISTERNA MAGNA yes normal   NUCHAL REGION yes normal   ORBITS yes normal   NASAL BONE yes normal   NOSE/LIP yes normal   FACIAL PROFILE yes normal   4 CHAMBERED HEART yes normal   OUTFLOW TRACTS yes normal   DIAPHRAGM yes normal   STOMACH yes normal   RENAL REGION yes normal   BLADDER yes normal   CORD INSERTION yes normal   3 VESSEL CORD yes normal   SPINE yes normal   ARMS/HANDS yes normal   LEGS/FEET yes normal   GENITALIA yes normal female        SUSPECTED ABNORMALITIES:  no  QUALITY OF SCAN: satisfactory   TECHNICIAN COMMENTS:  [place] 36+3 wks,cephalic,posterior pl gr 2,normal ovaries bilat,cx 4.2 cm,afi 15 cm,fhr 137 bpm,anatomy complete,no obvious abnormalities,EFW 2878 g,EDD  11/08/2017 by today's ultrasound       A copy of this report including all images has been saved and backed up to a second source for retrieval if needed. All measures and details of the anatomical scan, placentation, fluid volume and pelvic anatomy are contained in that report.  Judith King 10/14/2017 10:32 AM  Clinical Impression and recommendations:  I have reviewed the sonogram results above, combined with the patient's current clinical course, below are my impressions and any appropriate recommendations for management based on the sonographic findings.   1.  [id number] Estimated Date of Delivery: 11/08/17 by  Today's u/s, today's sonographic dating, with range of +/- 21 days. 2.  Normal fetal sonographic findings, specifically normal detailed anatomical evaluation,      no abnormalities noted 3.  Normal general sonographic findings  Recommend routine prenatal care to be initiated asap, including 2 hr GTT, followup  as clinically indicated, will have intake Prenatal visit 3.13.19.  Tilda Burrow 10/17/2017 5:47 AM      ROS History   Blood pressure 114/67, pulse 70, temperature 98.5 F (36.9 C), temperature source Oral, resp. rate 16, height 5\' 2"  (1.575 m), weight 187 lb 9.6 oz (85.1 kg), unknown if currently breastfeeding. Exam Physical Exam  Constitutional: She is oriented to person, place, and time. She appears well-developed and well-nourished.  HENT:  Head: Normocephalic.  Eyes: Pupils are equal, round, and reactive to light.  Neck: Normal range of motion.  Cardiovascular: Normal rate.  Respiratory: Effort normal.  GI: Soft.  Gravid uterus c/w dates  Genitourinary: Vagina normal.  Musculoskeletal: Normal range of motion.  Neurological: She is alert and oriented to person, place, and time. She has normal reflexes.  Skin: Skin is warm and dry.  Psychiatric: She has a normal mood and affect. Her behavior is normal. Judgment and thought content normal.     Prenatal labs: ABO, Rh: --/--/O POS (03/28 1000) Antibody: NEG (03/28 1000) Rubella: 1.18 (03/13 1030) RPR: Non Reactive (03/28 1000)  HBsAg: Negative (03/13 1030)  HIV: Non Reactive (03/13 1030)  GBS:     Assessment/Plan: Pregnancy 39w 2 d by late u/s Prior cesarean section for repeat Desire for elective sterilization.   Tilda BurrowJohn V Copper Basnett 11/04/2017, 2:28 PM

## 2017-11-05 DIAGNOSIS — D649 Anemia, unspecified: Secondary | ICD-10-CM | POA: Diagnosis not present

## 2017-11-05 DIAGNOSIS — Z3A39 39 weeks gestation of pregnancy: Secondary | ICD-10-CM

## 2017-11-05 DIAGNOSIS — O34211 Maternal care for low transverse scar from previous cesarean delivery: Secondary | ICD-10-CM

## 2017-11-05 LAB — CBC
HCT: 28.9 % — ABNORMAL LOW (ref 36.0–46.0)
Hemoglobin: 9.6 g/dL — ABNORMAL LOW (ref 12.0–15.0)
MCH: 28.8 pg (ref 26.0–34.0)
MCHC: 33.2 g/dL (ref 30.0–36.0)
MCV: 86.8 fL (ref 78.0–100.0)
PLATELETS: 150 10*3/uL (ref 150–400)
RBC: 3.33 MIL/uL — AB (ref 3.87–5.11)
RDW: 14.8 % (ref 11.5–15.5)
WBC: 7.8 10*3/uL (ref 4.0–10.5)

## 2017-11-05 MED ORDER — FERROUS SULFATE 325 (65 FE) MG PO TABS
325.0000 mg | ORAL_TABLET | Freq: Two times a day (BID) | ORAL | Status: DC
  Administered 2017-11-05 – 2017-11-07 (×5): 325 mg via ORAL
  Filled 2017-11-05 (×5): qty 1

## 2017-11-05 NOTE — Progress Notes (Signed)
Subjective: [name]: Cesarean Delivery Patient reports tolerating PO and no problems voiding.  Feeling gas bubbling in abdomen, but no flatus or BM.  Objective: Vital signs in last 24 hours: Temp:  [96.7 F (35.9 C)-98.5 F (36.9 C)] 98.5 F (36.9 C) (03/30 [id number]) Pulse Rate:  [63-87] 76 (03/30 0638) Resp:  [13-24] 18 (03/30 [id number]) BP: (91-123)/(35-95) 91/41 (03/30 [id number]) SpO2:  [95 %-100 %] 98 % (03/30 0745) Weight:  [187 lb 9.6 oz (85.1 kg)] 187 lb 9.6 oz (85.1 kg) (03/29 1241)  Physical Exam:  General: alert, cooperative and no distress Lochia: appropriate Uterine Fundus: firm Incision: healing well, 5-6 cm round area of serosanguineous drainage on LT Honeycomb dressing DVT Evaluation: No evidence of DVT seen on physical exam. Negative Homan's sign. No cords or calf tenderness. No significant calf/ankle edema.  Recent Labs    11/04/17 1904 11/05/17 0533  HGB 11.4* 9.6*  HCT 34.4* 28.9*    Assessment/Plan: Status post Cesarean section. Doing well postoperatively.  Continue current care.  Judith Moraolitta Angelisse Riso, MSN, CNM 11/05/2017, 10:31 AM

## 2017-11-05 NOTE — Anesthesia Postprocedure Evaluation (Signed)
Anesthesia Post Note  Patient: Judith LundborgElizabeth Moreno-Martinez  Procedure(s) Performed: REPEAT CESAREAN SECTION WITH BILATERAL TUBAL LIGATION (Bilateral Abdomen)     Patient location during evaluation: PACU Anesthesia Type: Spinal Level of consciousness: oriented and awake and alert Pain management: pain level controlled Vital Signs Assessment: post-procedure vital signs reviewed and stable Respiratory status: spontaneous breathing, respiratory function stable and patient connected to nasal cannula oxygen Cardiovascular status: blood pressure returned to baseline and stable Postop Assessment: no headache, no backache and no apparent nausea or vomiting Anesthetic complications: no    Last Vitals:  Vitals:   11/05/17 0429 11/05/17 0638  BP:  (!) 91/41  Pulse:  76  Resp:  18  Temp:  36.9 C  SpO2: 98% 97%    Last Pain:  Vitals:   11/05/17 0638  TempSrc: Oral   Pain Goal:                 Trellis PaganiniBREWER,Herberta Pickron N

## 2017-11-05 NOTE — Lactation Note (Signed)
This note was copied from a baby's chart. Lactation Consultation Note  Patient Name: Judith Clyde Lundborglizabeth Moreno-Martinez ZOXWR'UToday's Date: 11/05/2017 Reason for consult: Initial assessment;Term;Other (Comment)(DAT (+))  2228 hours old FT female who is being partially BF and formula fed by her mother, she's a P3 and experienced BF, she BF her other children for 6 months. Mom's feeding choice upon admission was to do both, breast and formula, explained to mom the importance of priorizing BF over formula feeding. Discussed volumes, parents were already feeding baby 25 ml of formula per feeding in addition to feedings at the breast.  Baby is on double phototherapy, mom is aware of DAT (+) when asked mom about pumping she voiced she feels more familiar with a hand pump that she can take home instead of being pumping with a  DEBP every 3 hours. Explained to mom the importance of consistent pumping, she said she'll think about it and let her nurse know if she decides to double pump but in the mean time she'll be using a hand pump. Reviewed assembly, storage and cleaning instructions. Taught mom how to hand express and she was able to get a few droplets of colostrum  Per mom feedings at the breast are comfortable, both nipples looked intact upon examination with no signs of trauma. Discussed the importance and characteristics of a deep latch for milk transfer and sore nipple prevention, mom verbalized understanding, she feels pretty confident about BF and will call for latch assistance if needed.  Encouraged mom to feed baby on cues STS 8-12 times/24 hours. Mom will put baby at the breast before offering formula bottle and will follow formula guidelines when supplementing. She'll pump in between feedings and feed baby that EBM on the next feeding; always priorizing breastmilk to complete volume. Reviewed BF brochure, BF resources and feeding diary, parents are aware of LC services and will call PRN.  Maternal Data Formula  Feeding for Exclusion: Yes Reason for exclusion: Mother's choice to formula and breast feed on admission Has patient been taught Hand Expression?: Yes Does the patient have breastfeeding experience prior to this delivery?: Yes  Feeding Feeding Type: Breast Fed  LATCH Score Latch: Grasps breast easily, tongue down, lips flanged, rhythmical sucking.  Audible Swallowing: A few with stimulation  Type of Nipple: Everted at rest and after stimulation  Comfort (Breast/Nipple): Soft / non-tender  Hold (Positioning): No assistance needed to correctly position infant at breast.  LATCH Score: 9  Interventions Interventions: Breast feeding basics reviewed;Skin to skin;Breast massage;Hand express;Breast compression;Hand pump  Lactation Tools Discussed/Used Tools: Pump Breast pump type: Manual WIC Program: No Pump Review: Setup, frequency, and cleaning Initiated by:: MPeck Date initiated:: 11/05/17   Consult Status Consult Status: Follow-up Date: 11/06/17 Follow-up type: In-patient    Bertine Schlottman Venetia ConstableS Greyson Riccardi 11/05/2017, 7:51 PM

## 2017-11-05 NOTE — Progress Notes (Signed)
CSW met with patient and infant at bedside with interpreter, Lorn Junes. CSW inquired about why patient was so late entering care, patient stated that she did not know she was pregnant until about [redacted] weeks gestation. Patient reported that this is her third child, this infant's name is [employer]. CSW informed patient since she had very limited prenatal care that her and her newborn would be drug screened per hospital policy. Patient stated understanding to urine and cord testing, CSW informed her that if results came back from cord tissue testing that a notification would be made to DSS. CSW inquired about substance or alcohol use during pregnancy, patient denied both. Patient did not have additional questions for CSW. CSW encouraged patient to reach out for assistance if needs arise, stated agreement.    Madilyn Fireman, MSW, [place] Social Worker [facility] [phone]

## 2017-11-06 DIAGNOSIS — Z9851 Tubal ligation status: Secondary | ICD-10-CM

## 2017-11-06 MED ORDER — IBUPROFEN 600 MG PO TABS: 600.0000 mg | ORAL_TABLET | Freq: Four times a day (QID) | ORAL | 0 refills | Status: AC

## 2017-11-06 MED ORDER — FERROUS SULFATE 325 (65 FE) MG PO TABS: 325.0000 mg | ORAL_TABLET | Freq: Two times a day (BID) | ORAL | 0 refills | Status: AC

## 2017-11-06 NOTE — Discharge Summary (Signed)
OB Discharge Summary     Patient Name: Judith King DOB: [birth year] MRN: [id number]  Date of admission: 11/04/2017 Delivering MD: Tilda Burrow   Date of discharge: 11/07/2017  Admitting diagnosis: PREV CSLDESIRE STERILIZATION Intrauterine pregnancy: [redacted]w[redacted]d     Secondary diagnosis:  Principal Problem:   Delivery of third pregnancy by cesarean section using transverse incision of lower segment of uterus Active Problems:   Encounter for sterilization   Previous cesarean section  x 2   S/P cesarean section   Postoperative anemia   S/P tubal ligation  Additional problems: Late to [facility] (initiated at 37wks)     Discharge diagnosis: Term Pregnancy Delivered                                                                                                Post partum procedures:postpartum tubal ligation  Augmentation: None  Complications: None  Hospital course:  Sceduled C/S   27 y.o. yo G3P3003 at [redacted]w[redacted]d was admitted to the hospital 11/04/2017 for scheduled cesarean section with the following indication:Elective Repeat.  Membrane Rupture Time/Date: 3:20 PM ,11/04/2017   Patient delivered a Viable infant.11/04/2017  Details of operation can be found in separate operative note.  Pateint had an uncomplicated postpartum course.  She is ambulating, tolerating a regular diet, passing flatus, and urinating well. Patient is discharged home in stable condition on  11/07/17         Physical exam  Vitals:   11/05/17 1736 11/06/17 0514 11/06/17 1731 11/07/17 0601  BP: (!) 91/42 (!) 90/43 112/72 (!) 115/54  Pulse: 64 63 84 77  Resp: 18 18 18 16   Temp: 98.3 F (36.8 C) 97.6 F (36.4 C) 97.8 F (36.6 C) 97.8 F (36.6 C)  TempSrc: Oral Oral Oral Oral  SpO2:    98%  Weight:      Height:       General: alert, cooperative and no distress Lochia: appropriate Uterine Fundus: firm Incision: Healing well with no significant drainage, No significant erythema, Dressing is clean, dry,  and intact DVT Evaluation: No evidence of DVT seen on physical exam. Labs: Lab Results  Component Value Date   WBC 7.8 11/05/2017   HGB 9.6 (L) 11/05/2017   HCT 28.9 (L) 11/05/2017   MCV 86.8 11/05/2017   PLT 150 11/05/2017   CMP Latest Ref Rng & Units 11/04/2017  Glucose 70 - 99 mg/dL -  BUN 6 - 23 mg/dL -  Creatinine 4.78 - 2.95 mg/dL 6.21  Sodium 308 - 657 mEq/L -  Potassium 3.5 - 5.1 mEq/L -  Chloride 96 - 112 mEq/L -  CO2 19 - 32 mEq/L -  Calcium 8.4 - 10.5 mg/dL -    Discharge instruction: per After Visit Summary and "Baby and Me Booklet".  After visit meds:  Allergies as of 11/07/2017   No Known Allergies     Medication List    TAKE these medications   ferrous sulfate 325 (65 FE) MG tablet Take 1 tablet (325 mg total) by mouth 2 (two) times daily with a meal.   ibuprofen 600 MG tablet Commonly  known as:  ADVIL,MOTRIN Take 1 tablet (600 mg total) by mouth every 6 (six) hours.   Prenatal Vitamins 28-0.8 MG Tabs Take 1 tablet by mouth daily.       Diet: routine diet  Activity: Advance as tolerated. Pelvic rest for 6 weeks.   Outpatient follow up:1 week for skin incision check, 4 weeks for post partum Follow up Appt:No future appointments. Follow up Visit: Follow-up Information    [facility]. Schedule an appointment as soon as possible for a visit.   Specialty:  Obstetrics and Gynecology Why:  For postpartum visit Contact information: [address] [place] [id number] [phone]          Postpartum contraception: Tubal Ligation  Newborn Data: Live born female  Birth Weight: 7 lb 9.2 oz (3435 g) APGAR: 9, 9  Newborn Delivery   Birth date/time:  11/04/2017 15:20:00 Delivery type:  C-Section, Low Transverse C-section categorization:  Repeat     Baby Feeding: Breast Disposition:home with mother   11/07/2017 Judith AdaJazma Christianne Zacher, DO

## 2017-11-06 NOTE — Discharge Instructions (Signed)

## 2017-11-06 NOTE — Progress Notes (Signed)
In house interpreter was used.

## 2017-11-07 ENCOUNTER — Telehealth: Payer: Self-pay | Admitting: *Deleted

## 2017-11-07 ENCOUNTER — Encounter (HOSPITAL_COMMUNITY): Payer: Self-pay

## 2017-11-07 NOTE — Progress Notes (Signed)
Lovenox 40 mg subQ missed on 3/30 0800 and 3/31 0800. Patient stated she did not receive this medication with spanish interpreter translating. Dr. Cyndi LennertHinds notified.

## 2017-11-07 NOTE — Telephone Encounter (Signed)
Tried to call pt to set up pp appointment.  LMOM.  11-07-2017  AS

## 2017-11-07 NOTE — Lactation Note (Signed)
This note was copied from a baby's chart. Lactation Consultation Note  Patient Name: Judith King ZOXWR'UToday's Date: 11/07/2017  Mom states breastfeeding is going well and milk is coming in.  Recommended she decrease formula now that breasts are filling.  No questions at this time.  Lactation outpatient services reviewed and encouraged prn.   Maternal Data    Feeding Feeding Type: Formula Nipple Type: Slow - flow  LATCH Score                   Interventions    Lactation Tools Discussed/Used     Consult Status      Huston FoleyMOULDEN, Jabron Weese S 11/07/2017, 8:45 AM

## 2017-11-08 ENCOUNTER — Ambulatory Visit: Payer: Self-pay

## 2017-11-08 NOTE — Lactation Note (Signed)
This note was copied from a baby's chart. Lactation Consultation Note  Patient Name: [name] WUJWJ'XToday's Date: 11/08/2017 Reason for consult: Follow-up assessment   P3,Baby 91 hours old.  Baby is breastfed and formula fed. Mother denies questions or concerns. Encouraged mother to breastfeed longer than 10 min to help establish her milk supply before offering formula. Explained supply and demand and waking techniques. Mom encouraged to feed baby 8-12 times/24 hours and with feeding cues.  Reviewed engorgement care and monitoring voids/stools.    Maternal Data    Feeding Feeding Type: Breast Fed Nipple Type: Slow - flow Length of feed: 10 min  LATCH Score                   Interventions    Lactation Tools Discussed/Used     Consult Status Consult Status: Complete    Hardie PulleyBerkelhammer, Aly Hauser Boschen 11/08/2017, 11:08 AM

## 2017-11-16 ENCOUNTER — Ambulatory Visit (INDEPENDENT_AMBULATORY_CARE_PROVIDER_SITE_OTHER): Payer: Self-pay | Admitting: Obstetrics and Gynecology

## 2017-11-16 ENCOUNTER — Encounter: Payer: Self-pay | Admitting: Obstetrics and Gynecology

## 2017-11-16 VITALS — BP 120/80 | HR 98 | Wt 173.0 lb

## 2017-11-16 DIAGNOSIS — Z9889 Other specified postprocedural states: Secondary | ICD-10-CM

## 2017-11-16 DIAGNOSIS — Z09 Encounter for follow-up examination after completed treatment for conditions other than malignant neoplasm: Secondary | ICD-10-CM

## 2017-11-16 NOTE — Progress Notes (Signed)
   Subjective:  Judith King is a 27 y.o. female now 1 weeks status post repeat C/S and BTL.  Review of Systems Negative except some mild pain.    Diet:   normal   Bowel movements : normal.  The patient is having mild pain.  Not requiring any analgesics  Objective:  BP 120/80 (BP Location: Right Arm, Patient Position: Sitting, Cuff Size: Small)   Pulse 98   Wt 173 lb (78.5 kg)   LMP  (LMP Unknown)   Breastfeeding? Yes   BMI 31.64 kg/m  General:Well developed, well nourished.  No acute distress. Abdomen: Bowel sounds normal, soft, non-tender. Pelvic Exam:  Incision(s):   Healing well, no drainage, no erythema, no hernia, no swelling, no dehiscence, small 5 mm area skin edge separation just to the right of the midline, patient will use Band-Aids daily   One tiny spot on incision, 5 mm area of skin edge separation just to the right of midline, no swelling, no inflammation     Assessment:  Post-Op 1 weeks s/p repeat C/S and BTL   Doing well postoperatively. One tiny spot on incision, 5 mm area of skin edge separation just to the right of midline, no swelling, no inflammation    Plan:  1.Wound care discussed: Band-Aid over area for 1 week 2. . current medications. 3. Activity restrictions: no driving and no lifting more than 25 pounds 4. return to work: 4 weeks. 5. Follow up in 4 weeks.   By signing my name below, I, Izna Ahmed, attest that this documentation has been prepared under the direction and in the presence of Tilda BurrowFerguson, Areesha Dehaven V, MD. Electronically Signed: Redge GainerIzna Ahmed, Medical Scribe. 11/16/17. 9:24 AM.  I personally performed the services described in this documentation, which was SCRIBED in my presence. The recorded information has been reviewed and considered accurate. It has been edited as necessary during review. Tilda BurrowJohn V Monika Chestang, MD

## 2017-12-06 ENCOUNTER — Other Ambulatory Visit: Payer: Self-pay | Admitting: Obstetrics and Gynecology

## 2017-12-19 ENCOUNTER — Encounter: Payer: Self-pay | Admitting: Obstetrics and Gynecology

## 2017-12-26 ENCOUNTER — Encounter: Payer: Self-pay | Admitting: Obstetrics and Gynecology

## 2017-12-28 ENCOUNTER — Encounter: Payer: Self-pay | Admitting: Obstetrics and Gynecology

## 2018-01-04 ENCOUNTER — Encounter: Payer: Self-pay | Admitting: Obstetrics and Gynecology

## 2018-01-11 ENCOUNTER — Encounter: Payer: Self-pay | Admitting: Obstetrics and Gynecology

## 2018-01-23 ENCOUNTER — Encounter: Payer: Self-pay | Admitting: Obstetrics and Gynecology

## 2018-01-25 ENCOUNTER — Encounter: Payer: Self-pay | Admitting: Obstetrics and Gynecology

## 2018-02-13 ENCOUNTER — Encounter: Payer: Self-pay | Admitting: Obstetrics and Gynecology

## 2018-02-20 ENCOUNTER — Encounter: Payer: Self-pay | Admitting: Obstetrics and Gynecology

## 2018-03-01 ENCOUNTER — Encounter: Payer: Self-pay | Admitting: Obstetrics and Gynecology

## 2018-03-15 ENCOUNTER — Encounter: Payer: Self-pay | Admitting: *Deleted

## 2018-03-15 ENCOUNTER — Encounter: Payer: Self-pay | Admitting: Obstetrics and Gynecology

## 2021-10-20 ENCOUNTER — Other Ambulatory Visit (HOSPITAL_COMMUNITY): Payer: Self-pay | Admitting: Nurse Practitioner

## 2021-10-20 DIAGNOSIS — R102 Pelvic and perineal pain: Secondary | ICD-10-CM

## 2021-10-20 DIAGNOSIS — Z8742 Personal history of other diseases of the female genital tract: Secondary | ICD-10-CM

## 2021-10-20 DIAGNOSIS — N949 Unspecified condition associated with female genital organs and menstrual cycle: Secondary | ICD-10-CM

## 2021-10-28 ENCOUNTER — Other Ambulatory Visit: Payer: Self-pay

## 2021-10-28 ENCOUNTER — Ambulatory Visit (HOSPITAL_COMMUNITY)
Admission: RE | Admit: 2021-10-28 | Discharge: 2021-10-28 | Disposition: A | Discharge status: Home / Self Care | Payer: Self-pay | Source: Ambulatory Visit | Attending: Nurse Practitioner | Admitting: Nurse Practitioner

## 2021-10-28 DIAGNOSIS — N949 Unspecified condition associated with female genital organs and menstrual cycle: Secondary | ICD-10-CM | POA: Insufficient documentation

## 2021-10-28 DIAGNOSIS — R102 Pelvic and perineal pain: Secondary | ICD-10-CM | POA: Insufficient documentation

## 2021-10-28 DIAGNOSIS — Z8742 Personal history of other diseases of the female genital tract: Secondary | ICD-10-CM | POA: Insufficient documentation

## 2023-07-06 IMAGING — US US PELVIS COMPLETE WITH TRANSVAGINAL
1 series · 13 of 25 positions shown · non-contrast
Comparison: None available.

CLINICAL DATA: Initial evaluation for pelvic pain, right adnexal
fullness, menorrhagia.

EXAM:
TRANSABDOMINAL AND TRANSVAGINAL ULTRASOUND OF PELVIS
TECHNIQUE: Both transabdominal and transvaginal ultrasound examinations of the
pelvis were performed. Transabdominal technique was performed for
global imaging of the pelvis including uterus, ovaries, adnexal
regions, and pelvic cul-de-sac. It was necessary to proceed with
endovaginal exam following the transabdominal exam to visualize the
endometrium and ovaries.

[Series 1: us pelvic complete with transvaginal · 13 of 121 slices shown]
[im 1/121]
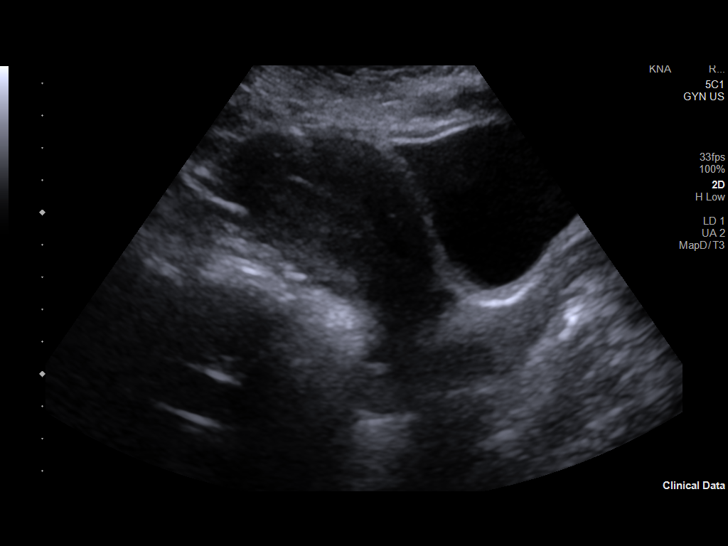
[im 11/121]
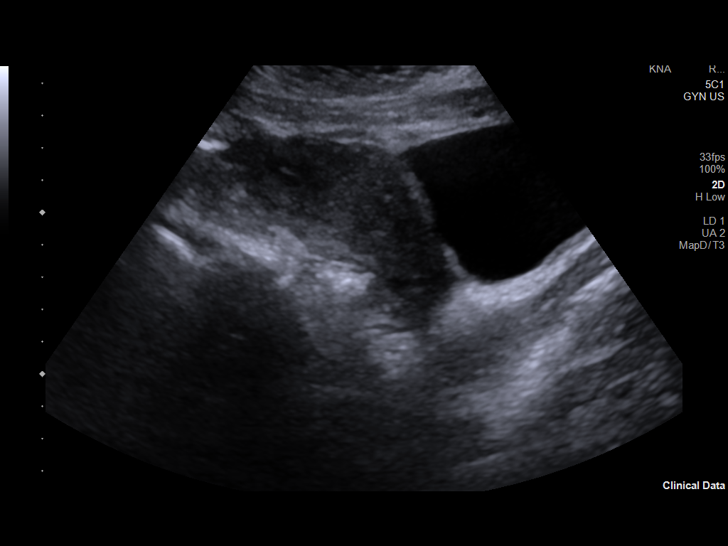
[im 21/121]
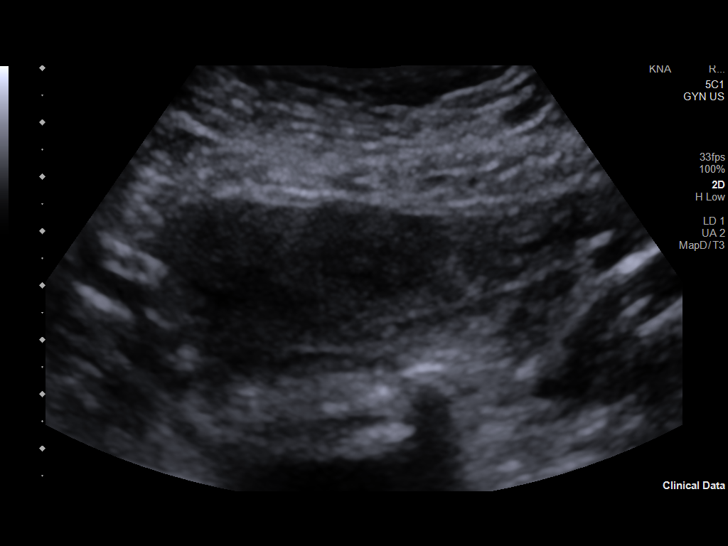
[im 31/121]
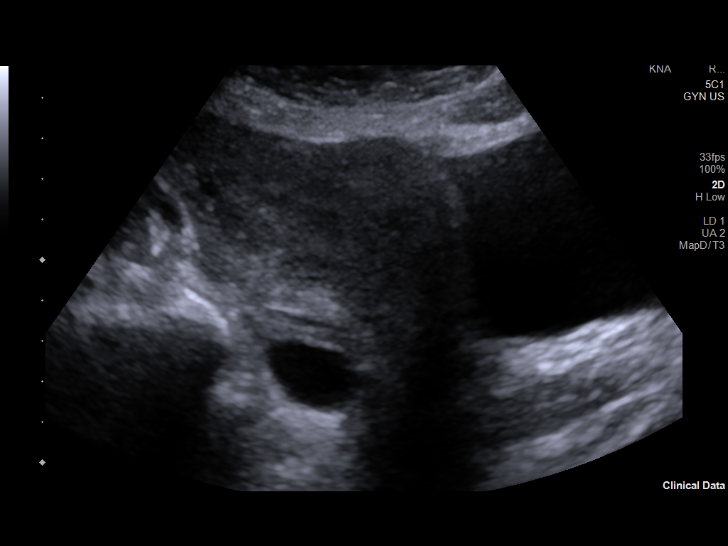
[im 41/121]
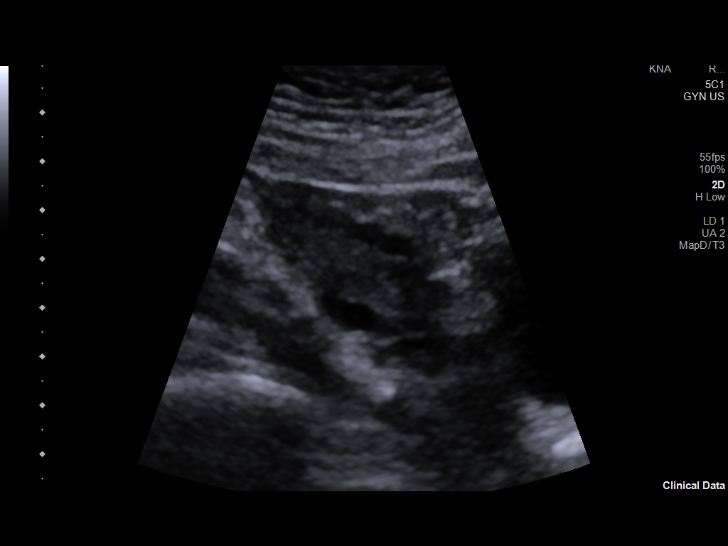
[im 51/121]
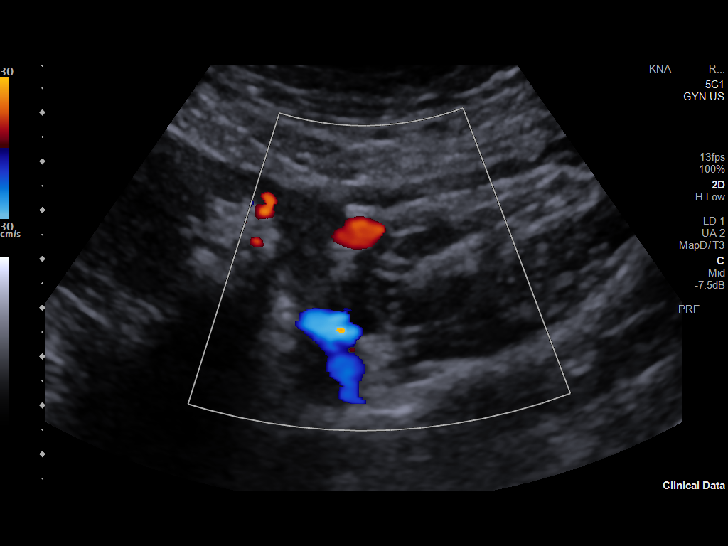
[im 61/121]
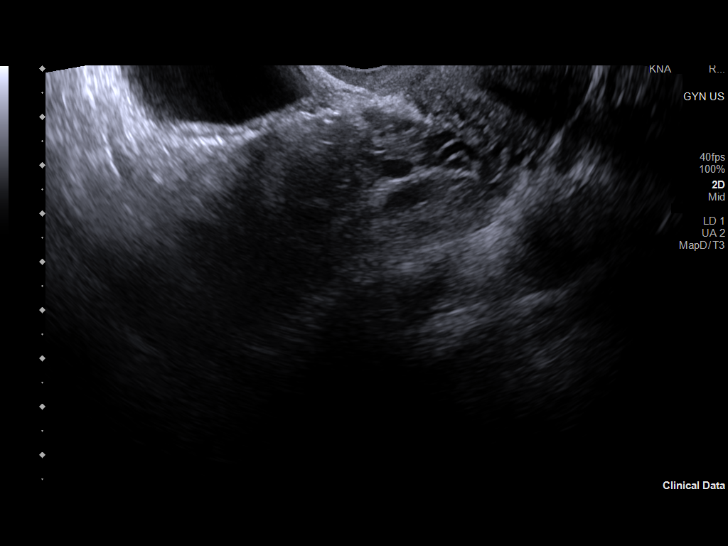
[im 71/121]
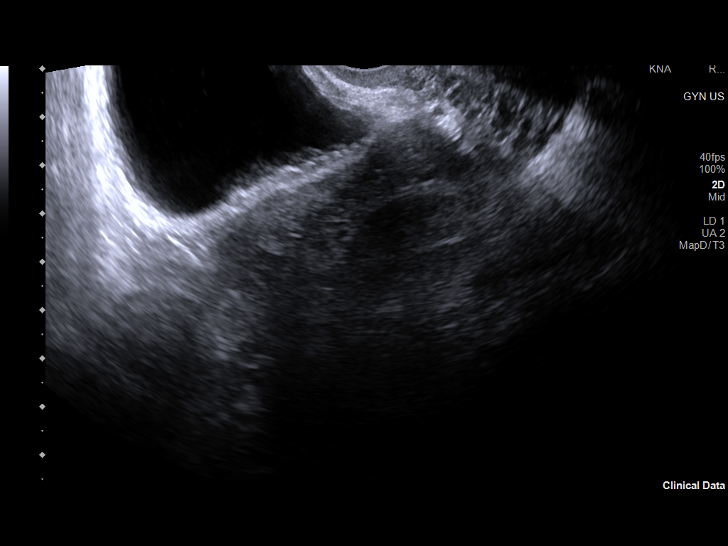
[im 81/121]
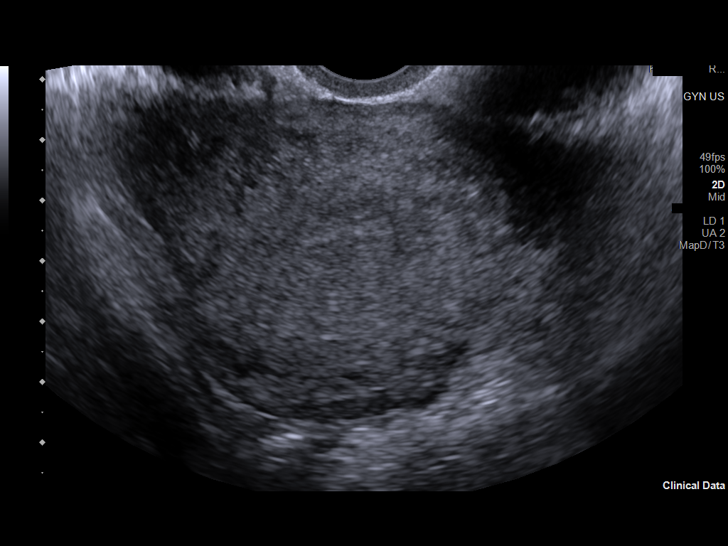
[im 91/121]
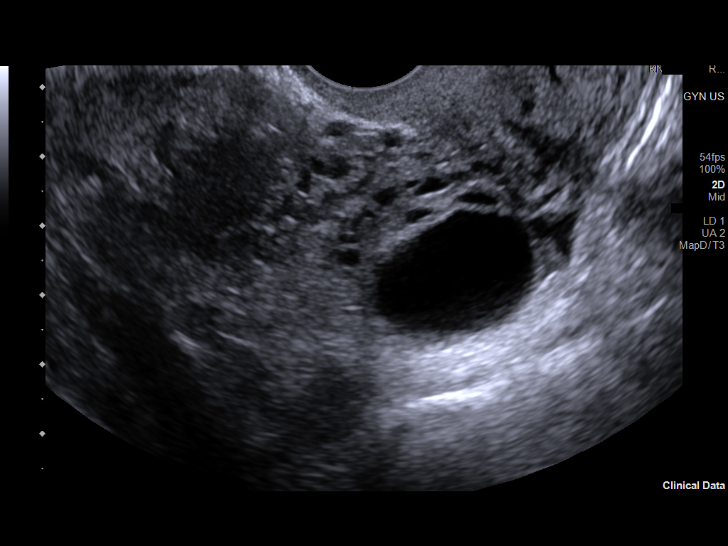
[im 101/121]
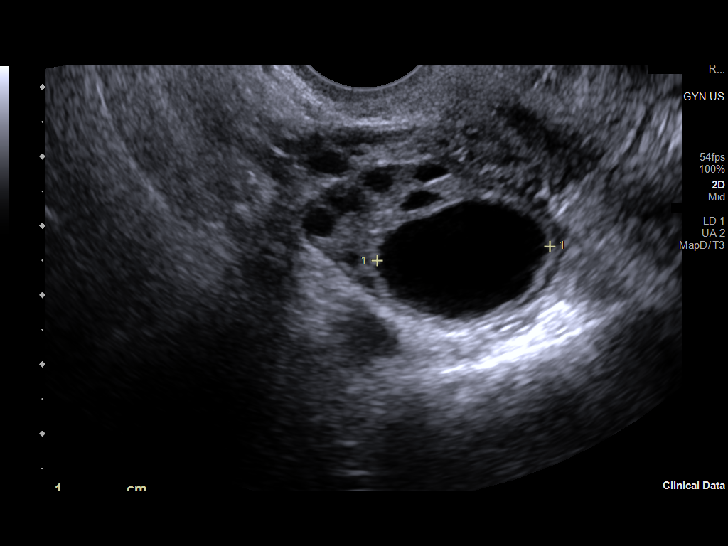
[im 111/121]
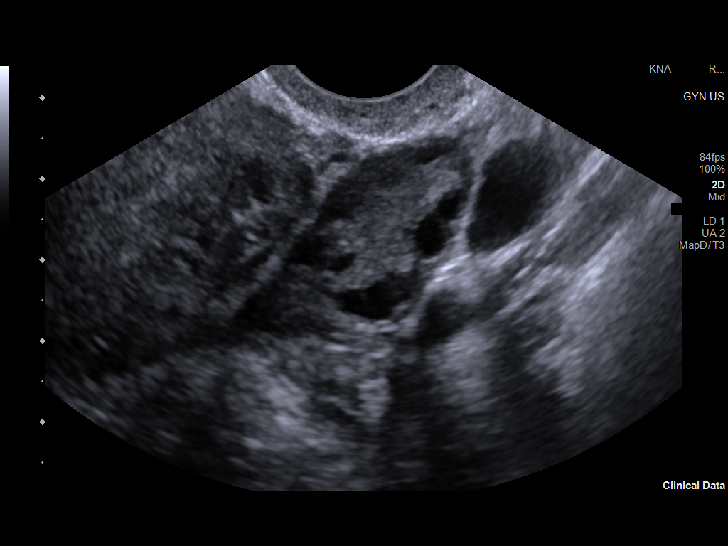
[im 121/121]
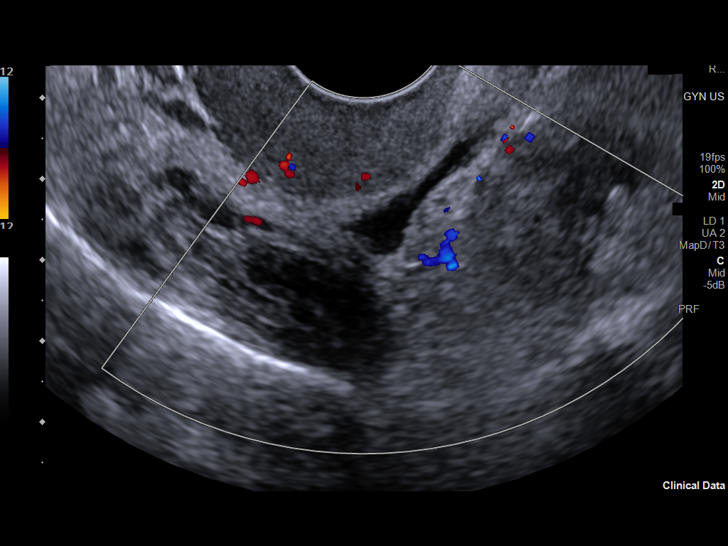

[13 of 25 positions shown; findings below may reference images not displayed]

FINDINGS: Uterus

Measurements: 10.5 x 4.7 x 5.8 cm = volume: 149.7 mL. Uterus is
anteverted. No discrete fibroid or other myometrial abnormality.

Endometrium

Thickness: 5 mm.  No focal abnormality visualized.

Right ovary

Measurements: 3.2 x 2.6 x 3.6 cm = volume: 15.6 mL. 2.5 cm dominant
follicle. No other adnexal mass.

Left ovary

Measurements: 3.1 x 2.3 x 2.1 cm = volume: 7.7 mL. Normal
appearance/no adnexal mass.

Other findings

Trace free fluid seen within the pelvis, likely physiologic.
IMPRESSION: 1. 2.5 cm dominant right ovarian follicle with associated trace free
fluid within the pelvis.
2. Normal sonographic appearance of the left ovary and uterus.
3. Endometrial stripe within normal limits measuring 5 mm in
thickness. If bleeding remains unresponsive to hormonal or medical
therapy, sonohysterogram should be considered for focal lesion
work-up. (Ref: Radiological Reasoning: Algorithmic Workup of
Abnormal Vaginal Bleeding with Endovaginal Sonography and
Sonohysterography. AJR 0993; 191:S68-73).

## 2023-07-16 ENCOUNTER — Encounter (HOSPITAL_COMMUNITY): Payer: Self-pay | Admitting: Emergency Medicine

## 2023-07-16 ENCOUNTER — Other Ambulatory Visit: Payer: Self-pay

## 2023-07-16 ENCOUNTER — Emergency Department (HOSPITAL_COMMUNITY)
Admission: EM | Admit: 2023-07-16 | Discharge: 2023-07-16 | Disposition: A | Discharge status: Home / Self Care | Payer: Self-pay | Attending: Emergency Medicine | Admitting: Emergency Medicine

## 2023-07-16 DIAGNOSIS — A059 Bacterial foodborne intoxication, unspecified: Secondary | ICD-10-CM | POA: Insufficient documentation

## 2023-07-16 MED ORDER — ONDANSETRON HCL 4 MG PO TABS: 4.0000 mg | ORAL_TABLET | Freq: Three times a day (TID) | ORAL | 0 refills | Status: DC | PRN

## 2023-07-16 MED ORDER — ONDANSETRON HCL 4 MG PO TABS: 4.0000 mg | ORAL_TABLET | Freq: Three times a day (TID) | ORAL | 0 refills | Status: AC | PRN

## 2023-07-16 NOTE — ED Triage Notes (Signed)
Pt reports dizziness with n/v that started during the night. Pt here with her 3 kids who are all have same symptoms and mother reports they all ate the same pizza last night.

## 2023-07-16 NOTE — Discharge Instructions (Signed)
Take Zofran for nausea as needed every 8 hours. Push fluids, advance diet as tolerated.   The dizziness can be part of the symptoms with food poisoning. This is treated supportively with Zofran to stop nausea, fluids and rest.   If you develop a fever, have severe pain or new concern, return to the emergency department for further evaluation.

## 2023-07-16 NOTE — ED Provider Notes (Signed)
[place] EMERGENCY DEPARTMENT AT [facility] Provider Note   CSN: [id number] Arrival date & time: 07/16/23  0920     History  Chief Complaint  Patient presents with   Emesis    Judith King is a 32 y.o. female.  Patient to ED with report of nausea, vomiting and dizziness that started overnight. She is here with her 3 children with same symptoms, all starting after the 4 of them ate pizza last night. No fever, abdominal pain or diarrhea.   The history is provided by the patient. No language interpreter was used.  Emesis      Home Medications Prior to Admission medications   Medication Sig Start Date End Date Taking? Authorizing Provider  ferrous sulfate 325 (65 FE) MG tablet Take 1 tablet (325 mg total) by mouth 2 (two) times daily with a meal. 11/06/17   Pincus Large, DO  ibuprofen (ADVIL,MOTRIN) 600 MG tablet Take 1 tablet (600 mg total) by mouth every 6 (six) hours. 11/06/17   Pincus Large, DO  ondansetron (ZOFRAN) 4 MG tablet Take 1 tablet (4 mg total) by mouth every 8 (eight) hours as needed. 07/16/23   Elpidio Anis, PA-C  Prenatal Vit-Fe Fumarate-FA (PRENATAL VITAMINS) 28-0.8 MG TABS Take 1 tablet by mouth daily. 05/20/12   Vanetta Mulders, MD      Allergies    Patient has no known allergies.    Review of Systems   Review of Systems  Gastrointestinal:  Positive for vomiting.    Physical Exam Updated Vital Signs BP 107/73 (BP Location: Right Arm)   Pulse 90   Temp 98.9 F (37.2 C) (Oral)   Resp 16   SpO2 100%  Physical Exam Vitals and nursing note reviewed.  Constitutional:      General: She is not in acute distress.    Appearance: She is well-developed. She is not ill-appearing.  Cardiovascular:     Rate and Rhythm: Normal rate.  Pulmonary:     Effort: Pulmonary effort is normal.  Abdominal:     General: Bowel sounds are normal. There is no distension.     Palpations: Abdomen is soft.     Tenderness: There is no  abdominal tenderness.  Musculoskeletal:        General: Normal range of motion.     Cervical back: Normal range of motion.  Skin:    General: Skin is warm and dry.  Neurological:     Mental Status: She is alert and oriented to person, place, and time.     GCS: GCS eye subscore is 4. GCS verbal subscore is 5. GCS motor subscore is 6.     Cranial Nerves: Cranial nerves 2-12 are intact.     Coordination: Coordination is intact.     Gait: Gait normal.     ED Results / Procedures / Treatments   Labs (all labs ordered are listed, but only abnormal results are displayed) Labs Reviewed - No data to display  EKG None  Radiology No results found.  Procedures Procedures    Medications Ordered in ED Medications - No data to display  ED Course/ Medical Decision Making/ A&P Clinical Course as of 07/16/23 1143  Sat Jul 16, 2023  1120 Patient to ED with her 3 children who all have nausea, vomiting and dizziness after eating pizza together last night. She is well appearing. Discussed symptomatic treatment as all that is indicated. Will provide Zofran.  [SU]    Clinical Course User Index [SU]  Elpidio Anis, PA-C                                 Medical Decision Making          Final Clinical Impression(s) / ED Diagnoses Final diagnoses:  Food poisoning    Rx / DC Orders ED Discharge Orders          Ordered    ondansetron (ZOFRAN) 4 MG tablet  Every 8 hours PRN,   Status:  Discontinued        07/16/23 1123    ondansetron (ZOFRAN) 4 MG tablet  Every 8 hours PRN        07/16/23 1131              Elpidio Anis, PA-C 07/16/23 1143    Vanetta Mulders, MD 07/17/23 [phone]

## 2023-07-18 ENCOUNTER — Telehealth: Payer: Self-pay

## 2023-07-18 NOTE — Telephone Encounter (Signed)
Attempted call with interpreter services ID#  for follow up of [facility] client dual enrolled from [facility]. Last seen at [facility] 10/14/22 for annual Physical exam, no future appointment showing.  No answer, left message, requesting return call.  Francee Nodal RN [employer]
